# Patient Record
Sex: Male | Born: 1955 | Race: Black or African American | Hispanic: No | Marital: Married | Smoking: Current every day smoker
Health system: Southern US, Community
[De-identification: ages and names within clinical notes are randomized; demographics above are authoritative.]

## PROBLEM LIST (undated history)

## (undated) DIAGNOSIS — J302 Other seasonal allergic rhinitis: Secondary | ICD-10-CM

## (undated) DIAGNOSIS — I10 Essential (primary) hypertension: Secondary | ICD-10-CM

## (undated) DIAGNOSIS — K649 Unspecified hemorrhoids: Secondary | ICD-10-CM

## (undated) DIAGNOSIS — D126 Benign neoplasm of colon, unspecified: Secondary | ICD-10-CM

## (undated) DIAGNOSIS — K5790 Diverticulosis of intestine, part unspecified, without perforation or abscess without bleeding: Secondary | ICD-10-CM

## (undated) DIAGNOSIS — M8580 Other specified disorders of bone density and structure, unspecified site: Secondary | ICD-10-CM

## (undated) DIAGNOSIS — M1990 Unspecified osteoarthritis, unspecified site: Secondary | ICD-10-CM

## (undated) DIAGNOSIS — E785 Hyperlipidemia, unspecified: Secondary | ICD-10-CM

## (undated) HISTORY — PX: POLYPECTOMY: SHX149

## (undated) HISTORY — DX: Other specified disorders of bone density and structure, unspecified site: M85.80

## (undated) HISTORY — DX: Essential (primary) hypertension: I10

## (undated) HISTORY — DX: Unspecified osteoarthritis, unspecified site: M19.90

## (undated) HISTORY — PX: SHOULDER SURGERY: SHX246

## (undated) HISTORY — DX: Diverticulosis of intestine, part unspecified, without perforation or abscess without bleeding: K57.90

## (undated) HISTORY — DX: Unspecified hemorrhoids: K64.9

## (undated) HISTORY — PX: COLONOSCOPY: SHX174

## (undated) HISTORY — DX: Hyperlipidemia, unspecified: E78.5

## (undated) HISTORY — DX: Benign neoplasm of colon, unspecified: D12.6

## (undated) HISTORY — DX: Other seasonal allergic rhinitis: J30.2

---

## 2005-06-02 ENCOUNTER — Ambulatory Visit: Payer: Self-pay | Admitting: Family Medicine

## 2005-06-10 ENCOUNTER — Encounter: Admission: RE | Admit: 2005-06-10 | Discharge: 2005-06-10 | Payer: Self-pay | Admitting: Family Medicine

## 2005-07-11 ENCOUNTER — Ambulatory Visit: Payer: Self-pay | Admitting: Family Medicine

## 2005-07-13 ENCOUNTER — Emergency Department (HOSPITAL_COMMUNITY): Admission: EM | Admit: 2005-07-13 | Discharge: 2005-07-13 | Payer: Self-pay | Admitting: Emergency Medicine

## 2006-12-18 ENCOUNTER — Ambulatory Visit: Payer: Self-pay | Admitting: Family Medicine

## 2006-12-18 DIAGNOSIS — I10 Essential (primary) hypertension: Secondary | ICD-10-CM

## 2006-12-18 DIAGNOSIS — E782 Mixed hyperlipidemia: Secondary | ICD-10-CM | POA: Insufficient documentation

## 2006-12-21 ENCOUNTER — Ambulatory Visit: Payer: Self-pay | Admitting: Family Medicine

## 2006-12-21 ENCOUNTER — Encounter (INDEPENDENT_AMBULATORY_CARE_PROVIDER_SITE_OTHER): Payer: Self-pay | Admitting: Internal Medicine

## 2006-12-27 LAB — CONVERTED CEMR LAB
Basophils Relative: 0.1 % (ref 0.0–1.0)
Creatinine, Ser: 1.4 mg/dL (ref 0.4–1.5)
Eosinophils Absolute: 0.1 10*3/uL (ref 0.0–0.6)
Eosinophils Relative: 1.4 % (ref 0.0–5.0)
HCT: 43.2 % (ref 39.0–52.0)
HDL: 54.6 mg/dL (ref >39.0)
Lymphocytes Relative: 46.8 % — ABNORMAL HIGH (ref 12.0–46.0)
MCHC: 34 g/dL (ref 30.0–36.0)
MCV: 88.4 fL (ref 78.0–100.0)
Monocytes Absolute: 0.5 10*3/uL (ref 0.2–0.7)
Monocytes Relative: 10.5 % (ref 3.0–11.0)
Neutrophils Relative %: 41.2 % — ABNORMAL LOW (ref 43.0–77.0)
PSA: 0.67 ng/mL (ref 0.10–4.00)
Platelets: 186 10*3/uL (ref 150–400)
RBC: 4.88 M/uL (ref 4.22–5.81)
RDW: 13.2 % (ref 11.5–14.6)
Total Bilirubin: 0.7 mg/dL (ref 0.3–1.2)
Total CHOL/HDL Ratio: 5
Triglycerides: 142 mg/dL (ref 0–149)
WBC: 4.5 10*3/uL (ref 4.5–10.5)

## 2007-01-08 ENCOUNTER — Encounter: Payer: Self-pay | Admitting: Internal Medicine

## 2007-01-19 ENCOUNTER — Ambulatory Visit: Payer: Self-pay | Admitting: Family Medicine

## 2007-01-19 DIAGNOSIS — J019 Acute sinusitis, unspecified: Secondary | ICD-10-CM

## 2007-01-19 DIAGNOSIS — J309 Allergic rhinitis, unspecified: Secondary | ICD-10-CM | POA: Insufficient documentation

## 2007-02-21 ENCOUNTER — Ambulatory Visit: Payer: Self-pay | Admitting: Family Medicine

## 2007-03-29 ENCOUNTER — Ambulatory Visit: Payer: Self-pay | Admitting: Family Medicine

## 2007-03-30 LAB — CONVERTED CEMR LAB
HDL: 56.1 mg/dL (ref >39.0)
LDL Cholesterol: 120 mg/dL — ABNORMAL HIGH (ref 0–99)
Triglycerides: 100 mg/dL (ref 0–149)
VLDL: 20 mg/dL (ref 0–40)

## 2007-04-02 ENCOUNTER — Telehealth (INDEPENDENT_AMBULATORY_CARE_PROVIDER_SITE_OTHER): Payer: Self-pay | Admitting: Internal Medicine

## 2007-05-10 ENCOUNTER — Ambulatory Visit: Payer: Self-pay | Admitting: Internal Medicine

## 2007-05-10 DIAGNOSIS — F17200 Nicotine dependence, unspecified, uncomplicated: Secondary | ICD-10-CM

## 2007-11-13 ENCOUNTER — Ambulatory Visit: Payer: Self-pay | Admitting: Family Medicine

## 2007-11-19 ENCOUNTER — Ambulatory Visit: Payer: Self-pay | Admitting: Family Medicine

## 2007-11-22 LAB — CONVERTED CEMR LAB
ALT: 14 units/L (ref 0–53)
BUN: 11 mg/dL (ref 6–23)
CO2: 29 meq/L (ref 19–32)
Calcium: 9.2 mg/dL (ref 8.4–10.5)
HDL: 55.3 mg/dL (ref >39.0)
LDL Cholesterol: 91 mg/dL (ref 0–99)
Potassium: 4.6 meq/L (ref 3.5–5.1)
Total CHOL/HDL Ratio: 2.8
VLDL: 11 mg/dL (ref 0–40)

## 2008-05-13 ENCOUNTER — Ambulatory Visit: Payer: Self-pay | Admitting: Family Medicine

## 2008-05-14 ENCOUNTER — Encounter (INDEPENDENT_AMBULATORY_CARE_PROVIDER_SITE_OTHER): Payer: Self-pay | Admitting: Internal Medicine

## 2008-05-14 LAB — CONVERTED CEMR LAB
Chloride: 109 meq/L (ref 96–112)
Cholesterol: 212 mg/dL — ABNORMAL HIGH (ref 0–200)
GFR calc non Af Amer: 74.33 mL/min (ref >60)
Glucose, Bld: 83 mg/dL (ref 70–99)
HDL: 67.9 mg/dL (ref >39.00)
PSA: 0.57 ng/mL (ref 0.10–4.00)
Potassium: 4.3 meq/L (ref 3.5–5.1)
Sodium: 140 meq/L (ref 135–145)
Triglycerides: 54 mg/dL (ref 0.0–149.0)
VLDL: 10.8 mg/dL (ref 0.0–40.0)

## 2008-06-30 ENCOUNTER — Ambulatory Visit: Payer: Self-pay | Admitting: Family Medicine

## 2008-07-01 LAB — CONVERTED CEMR LAB
ALT: 24 units/L (ref 0–53)
AST: 22 units/L (ref 0–37)
HDL: 72.3 mg/dL (ref >39.00)
LDL Cholesterol: 97 mg/dL (ref 0–99)
Total CHOL/HDL Ratio: 3

## 2008-11-07 ENCOUNTER — Telehealth (INDEPENDENT_AMBULATORY_CARE_PROVIDER_SITE_OTHER): Payer: Self-pay | Admitting: Internal Medicine

## 2008-11-18 ENCOUNTER — Ambulatory Visit: Payer: Self-pay | Admitting: Family Medicine

## 2008-11-18 DIAGNOSIS — N529 Male erectile dysfunction, unspecified: Secondary | ICD-10-CM | POA: Insufficient documentation

## 2008-11-18 LAB — CONVERTED CEMR LAB
ALT: 23 units/L (ref 0–53)
AST: 22 units/L (ref 0–37)
BUN: 12 mg/dL (ref 6–23)
CO2: 28 meq/L (ref 19–32)
HDL: 62.1 mg/dL (ref >39.00)
LDL Cholesterol: 109 mg/dL — ABNORMAL HIGH (ref 0–99)
Sodium: 140 meq/L (ref 135–145)
Triglycerides: 63 mg/dL (ref 0.0–149.0)

## 2008-12-12 ENCOUNTER — Ambulatory Visit: Payer: Self-pay | Admitting: Family Medicine

## 2009-03-16 ENCOUNTER — Telehealth: Payer: Self-pay | Admitting: Family Medicine

## 2009-03-18 ENCOUNTER — Encounter: Payer: Self-pay | Admitting: Family Medicine

## 2009-04-23 ENCOUNTER — Ambulatory Visit: Payer: Self-pay | Admitting: Family Medicine

## 2009-06-16 ENCOUNTER — Ambulatory Visit: Payer: Self-pay | Admitting: Family Medicine

## 2009-06-18 LAB — CONVERTED CEMR LAB
BUN: 12 mg/dL (ref 6–23)
Bilirubin, Direct: 0.1 mg/dL (ref 0.0–0.3)
CO2: 27 meq/L (ref 19–32)
Calcium: 9.2 mg/dL (ref 8.4–10.5)
Chloride: 108 meq/L (ref 96–112)
GFR calc non Af Amer: 81.97 mL/min (ref >60)
HDL: 63.8 mg/dL (ref >39.00)
Total CHOL/HDL Ratio: 3
Total Protein: 6.5 g/dL (ref 6.0–8.3)
VLDL: 13.8 mg/dL (ref 0.0–40.0)

## 2009-12-21 ENCOUNTER — Ambulatory Visit: Payer: Self-pay | Admitting: Family Medicine

## 2010-02-09 NOTE — Assessment & Plan Note (Signed)
Summary: HA,SWOLLEN EYES/CLE   Vital Signs:  Patient profile:   55 year old male Height:      67 inches Weight:      193.25 pounds BMI:     30.38 Temp:     97.7 degrees F oral Pulse rate:   80 / minute Pulse rhythm:   regular BP sitting:   132 / 98  (left arm) Cuff size:   regular  Vitals Entered By: Linde Gillis CMA Duncan Dull) (April 23, 2009 3:07 PM) CC: sinuses   History of Present Illness: 55 yo with right sided sinus pressure x 2 weeks. Getting worse.  A little sinus congestion, but pressure is the bothersome part. Had chills over last couple of days.  Feels like he has so much pressure his right eye is swollen. No drainage from right eye.  No blurred vision, photophobia or eye pain. No cough, no wheezing, or SOB.  Had similar problem last year, also only on right side.  Current Medications (verified): 1)  Diovan 80 Mg  Tabs (Valsartan) .... 3 Once Daily For Bp 2)  Norvasc 5 Mg  Tabs (Amlodipine Besylate) .Marland Kitchen.. 1 Once Daily For Bp By Mouth 3)  Simvastatin 80 Mg Tabs (Simvastatin) .Marland Kitchen.. 1daily For Cholesterol 4)  Augmentin 875-125 Mg Tabs (Amoxicillin-Pot Clavulanate) .Marland Kitchen.. 1 Tab By Mouth Two Times A Day X 10 Days  Allergies (verified): No Known Drug Allergies  Review of Systems      See HPI General:  Complains of chills and fever. Eyes:  Denies blurring, eye pain, itching, and light sensitivity. ENT:  Complains of nasal congestion, sinus pressure, and sore throat; denies ear discharge and earache. CV:  Denies chest pain or discomfort. Resp:  Denies cough.  Physical Exam  General:  alert, well-developed, well-nourished, and well-hydrated.   Nose:  right sinuses eryth, edematus, tender left sinus ok Mouth:  pharynx pink and moist.   Lungs:  normal respiratory effort, no intercostal retractions, no accessory muscle use, and normal breath sounds.   Heart:  normal rate, regular rhythm, and no murmur.   Extremities:  no edema either lower leg Psych:  normally  interactive and slightly anxious.     Impression & Recommendations:  Problem # 1:  SINUSITIS- ACUTE-NOS (ICD-461.9) Assessment New Given duration and progression of symptoms, treat for bacterial sinusitis with Augmentin. Advised seeing an ENT as likely a structural issue, always occurs on right side and right sinus cavity is visibly more edematous and irritated than left.  He wants to think about referral.  Will get back to [place]. His updated medication list for this problem includes:    Augmentin 875-125 Mg Tabs (Amoxicillin-pot clavulanate) .Marland Kitchen... 1 tab by mouth two times a day x 10 days  Complete Medication List: 1)  Diovan 80 Mg Tabs (Valsartan) .... 3 once daily for bp 2)  Norvasc 5 Mg Tabs (Amlodipine besylate) .Marland Kitchen.. 1 once daily for bp by mouth 3)  Simvastatin 80 Mg Tabs (Simvastatin) .Marland Kitchen.. 1daily for cholesterol 4)  Augmentin 875-125 Mg Tabs (Amoxicillin-pot clavulanate) .Marland Kitchen.. 1 tab by mouth two times a day x 10 days  Patient Instructions: 1)  Take 400-600 mg of Ibuprofen (Advil, Motrin) with food every 4-6 hours as needed  for relief of pain or comfort of fever.  Prescriptions: AUGMENTIN 875-125 MG TABS (AMOXICILLIN-POT CLAVULANATE) 1 tab by mouth two times a day x 10 days  #20 x 0   Entered and Authorized by:   Ruthe Mannan MD   Signed by:  Ruthe Mannan MD on 04/23/2009   Method used:   [facility]        [facility] (ret[facility])       [address]       [place], [place]  [address]       Ph: [phone] or [phone]       Fax: [phone]   RxID:   [id number]   Current Allergies (reviewed today): No known allergies

## 2010-02-09 NOTE — Assessment & Plan Note (Signed)
Summary: 6 MONTH FOLLOW UP/[facility]   Vital Signs:  Patient profile:   55 year old male Height:      67 inches Weight:      196.38 pounds BMI:     30.87 Temp:     97.7 degrees F oral Pulse rate:   60 / minute Pulse rhythm:   regular BP sitting:   138 / 72  (left arm) Cuff size:   regular  Vitals Entered By: Linde Gillis CMA Duncan Dull) (June 16, 2009 8:07 AM) CC: 6 month follow up   History of Present Illness: 55 yo here for 6 month follow up HTN and HLD.    HLD- had been well controlled on Simvastatin 80 mg daily.  Last checked in December.  Since that time, he admits to not always being compliant with his diet or medications. Has gained 3 pounds since last OV. No myalgias with high dose Simvastatin.  HTN- has been well controlled on Diovan 240 mg daily and Norvasc 5 mg daily.  Needs refills.  No blurred vision, SOB, CP or LE edema.  Current Medications (verified): 1)  Diovan 80 Mg  Tabs (Valsartan) .... 3 Once Daily For Bp 2)  Norvasc 5 Mg  Tabs (Amlodipine Besylate) .Marland Kitchen.. 1 Once Daily For Bp By Mouth 3)  Simvastatin 80 Mg Tabs (Simvastatin) .Marland Kitchen.. 1daily For Cholesterol  Allergies (verified): No Known Drug Allergies  Review of Systems      See HPI General:  Denies malaise. Eyes:  Denies blurring. CV:  Denies chest pain or discomfort and shortness of breath with exertion. Resp:  Denies shortness of breath. GI:  Denies abdominal pain. MS:  Denies muscle weakness. Derm:  Denies rash. Neuro:  Denies headaches, visual disturbances, and weakness. Psych:  Denies anxiety and depression.  Physical Exam  General:  alert, well-developed, well-nourished, and well-hydrated.   Mouth:  pharynx pink and moist.   Lungs:  normal respiratory effort, no intercostal retractions, no accessory muscle use, and normal breath sounds.   Heart:  normal rate, regular rhythm, and no murmur.   Extremities:  no edema either lower leg Psych:  normally interactive and slightly anxious.     Impression &  Recommendations:  Problem # 1:  HYPERLIPIDEMIA (ICD-272.2) Assessment Unchanged Recheck FLP, hepatic panel today given that he is on high dose Simvastatin. His updated medication list for this problem includes:    Simvastatin 80 Mg Tabs (Simvastatin) .Marland Kitchen... 1daily for cholesterol  Orders: Venipuncture ([address]) TLB-Lipid Panel (80061-LIPID) TLB-Hepatic/Liver Function Pnl (80076-HEPATIC)  Problem # 2:  HYPERTENSION (ICD-401.9) Assessment: Unchanged Continue current meds. His updated medication list for this problem includes:    Diovan 80 Mg Tabs (Valsartan) .Marland KitchenMarland KitchenMarland KitchenMarland Kitchen 3 once daily for bp    Norvasc 5 Mg Tabs (Amlodipine besylate) .Marland Kitchen... 1 once daily for bp by mouth  Orders: Venipuncture (62130) TLB-BMP (Basic Metabolic Panel-BMET) (80048-METABOL)  Problem # 3:  ERECTILE DYSFUNCTION, ORGANIC (ICD-607.84) Assessment: Unchanged His insurance company no longer covering levitra.  He will call to find out what they do cover.  Complete Medication List: 1)  Diovan 80 Mg Tabs (Valsartan) .... 3 once daily for bp 2)  Norvasc 5 Mg Tabs (Amlodipine besylate) .Marland Kitchen.. 1 once daily for bp by mouth 3)  Simvastatin 80 Mg Tabs (Simvastatin) .Marland Kitchen.. 1daily for cholesterol Prescriptions: DIOVAN 80 MG  TABS (VALSARTAN) 3 once daily for BP  #90 Tablet x 6   Entered and Authorized by:   Ruthe Mannan MD   Signed by:   Ruthe Mannan  MD on 06/16/2009   Method used:   [facility]        [facility] ([facility]ail)       [address]       [place], [place]  [address]       Ph: [phone] or [phone]       Fax: [phone]   RxID:   [id number] NORVASC 5 MG  TABS (AMLODIPINE BESYLATE) 1 once daily for BP by mouth  #90 x 11   Entered and Authorized by:   Ruthe Mannan MD   Signed by:   Ruthe Mannan MD on 06/16/2009   Method used:   [facility]        [facility] (retail)       [address]       [place], [place]  [address]       Ph: [phone] or [phone]        Fax: [phone]   RxID:   [id number]   Current Allergies (reviewed today): No known allergies

## 2010-02-09 NOTE — Medication Information (Signed)
Summary: Approval for Diovan/[employer]  Approval for Diovan/[employer]   Imported By: Lanelle Bal 03/24/2009 08:58:48  _____________________________________________________________________  External Attachment:    Type:   Image     Comment:   External Document

## 2010-02-09 NOTE — Progress Notes (Signed)
Summary: Prior Authorization Diovan 80mg   Phone Note From Pharmacy Call back at ph [phone] fax [phone]   [facility] [address] [id number] Call For: Dr. Dayton Martes  Summary of Call: Received fax from pharmacy stating that PA is needed for Diovan 80mg .  Called [phone] to request paperwork, it will be faxed to our office today.  Linde Gillis CMA Duncan Dull)  March 16, 2009 11:44 AM   Received PA form, in your IN box.  Linde Gillis CMA Duncan Dull)  March 16, 2009 3:02 PM    Follow-up for Phone Call        I will not be in office until Weds. . I am just trying to catch up on work but still on vacation. Thank you. Follow-up by: Ruthe Mannan MD,  March 16, 2009 4:42 PM  Additional Follow-up for Phone Call Additional follow up Details #1::        In my box. Additional Follow-up by: Ruthe Mannan MD,  March 18, 2009 10:02 AM    Additional Follow-up for Phone Call Additional follow up Details #2::    Form faxed to [employer] at [phone] Follow-up by: Linde Gillis CMA Duncan Dull),  March 18, 2009 10:07 AM   Appended Document: Prior Authorization Diovan 80mg  Received PA Approval for Diovan.  Approved through 03/18/2010, pharmacy and patient notified.

## 2010-02-09 NOTE — Letter (Signed)
Summary: Out of Work  [facility]  [address] [place], [place] [address]   Phone: [phone]  Fax: [phone]    April 23, 2009   Employee:  Victorino Dike    To Whom It May Concern:   For Medical reasons, please excuse the above named employee from work for the following dates:  Start:   04/22/2009  End:   04/27/2009  If you need additional information, please feel free to contact our office.         Sincerely,    Ruthe Mannan MD

## 2010-02-11 NOTE — Assessment & Plan Note (Signed)
Summary: 6 MONTH FOLLOW UP/[facility]   Vital Signs:  Patient profile:   55 year old Erik [name] Height:      67 inches Weight:      198 pounds BMI:     31.12 Temp:     98.5 degrees F oral Pulse rate:   68 / minute Pulse rhythm:   regular BP sitting:   140 / 80  (left arm) Cuff size:   regular  Vitals Entered By: [name] CMA Duncan Dull) (December 21, 2009 8:07 AM)  History of Present Illness: 55 yo here for 6 month follow up HTN and HLD.    HLD- had been well controlled on Simvastatin 80 mg daily.  Last checked in June.  Since that time, he admits to not always being compliant with his diet or medications. . No myalgias with high dose Simvastatin, has been on it for years.  HTN- has been well controlled on Diovan 240 mg daily and Norvasc 5 mg daily.  Needs refills.  No blurred vision, SOB, CP or LE edema. Rushed to get her this morning, slightly elevated.  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  Current Medications (verified): 1)  Diovan 80 Mg  Tabs (Valsartan) .... 3 Once Daily For Bp 2)  Norvasc 5 Mg  Tabs (Amlodipine Besylate) .Marland Kitchen.. 1 Once Daily For Bp By Mouth 3)  Simvastatin 80 Mg Tabs (Simvastatin) .Marland Kitchen.. 1daily For Cholesterol  Allergies (verified): No Known Drug Allergies  Past History:  Past Medical History: Last updated: 01/26/2007 Hyperlipidemia Hypertension  Past Surgical History: Last updated: 01-26-2007 1973 / 1997   Right shoulder  Family History: Last updated: 2007/01/26 Father: Died 37 - MI (#1) Mother: Alive 78 L & W Siblings: 2 brothers alive, one with HBP               2 sisters L & W  PGF died 37, old age PGM died 11, CVA DM: P. Uncles x 2 Breast CA:  MGM died at 13  Social History: Last updated: 01-26-07 Marital Status: Married Children: 2 (22 and 15) both at home Occupation: Naval architect (tanker gas truck) Current Smoker, 1/2 PPD Alcohol use-yes, rare Regular exercise-no  Risk Factors: Alcohol Use: <1 (05/13/2008) Caffeine Use: 10  (05/13/2008) Exercise: no (05/13/2008)  Risk Factors: Smoking Status: never (11/18/2008) Packs/Day: 12 cigs qd (11/18/2008) Cigars/wk: 0 (05/13/2008) Cans of tobacco/wk: 0 (05/13/2008) Passive Smoke Exposure: no (12/18/2006)  Review of Systems      See HPI Eyes:  Denies blurring. CV:  Denies chest pain or discomfort. Resp:  Denies shortness of breath. GI:  Denies abdominal pain.  Physical Exam  General:  alert, well-developed, well-nourished, and well-hydrated.   Lungs:  normal respiratory effort, no intercostal retractions, no accessory muscle use, and normal breath sounds.   Heart:  normal rate, regular rhythm, and no murmur.   Extremities:  no edema either lower leg Psych:  normally interactive and slightly anxious.     Impression & Recommendations:  Problem # 1:  HYPERLIPIDEMIA (ICD-272.2) Assessment Unchanged Stable, continue Simvastatin. Follow up 6 months, no need to recheck labs today. His updated medication list for this problem includes:    Simvastatin 80 Mg Tabs (Simvastatin) .Marland Kitchen... 1daily for cholesterol  Problem # 2:  HYPERTENSION (ICD-401.9) Assessment: Unchanged Continue current meds.  Follow up in 6 months. His updated medication list for this problem includes:    Diovan 80 Mg Tabs (Valsartan) .Marland KitchenMarland KitchenMarland KitchenMarland Kitchen 3 once daily for bp    Norvasc 5 Mg Tabs (Amlodipine besylate) .Marland KitchenMarland KitchenMarland KitchenMarland Kitchen  1 once daily for bp by mouth  Complete Medication List: 1)  Diovan 80 Mg Tabs (Valsartan) .... 3 once daily for bp 2)  Norvasc 5 Mg Tabs (Amlodipine besylate) .Marland Kitchen.. 1 once daily for bp by mouth 3)  Simvastatin 80 Mg Tabs (Simvastatin) .Marland Kitchen.. 1daily for cholesterol Prescriptions: SIMVASTATIN 80 MG TABS (SIMVASTATIN) 1daily for cholesterol  #90 x 3   Entered by:   [name] CMA (AAMA)   Authorized by:   Ruthe Mannan MD   Signed by:   [name] CMA (AAMA) on 12/21/2009   Method used:   [facility]        [facility] ([facility]ail)       [address]        [place], [place]  [address]       Ph: [phone] or [phone]       Fax: [phone]   RxID:   [id number] NORVASC 5 MG  TABS (AMLODIPINE BESYLATE) 1 once daily for BP by mouth  #90 x 3   Entered by:   [name] CMA (AAMA)   Authorized by:   Ruthe Mannan MD   Signed by:   [name] CMA (AAMA) on 12/21/2009   Method used:   [facility]        [facility] (retail)       [address]       [place], [place]  [address]       Ph: [phone] or [phone]       Fax: [phone]   RxID:   [id number] DIOVAN 80 MG  TABS (VALSARTAN) 3 once daily for BP  #90 Tablet x 11   Entered by:   [name] CMA (AAMA)   Authorized by:   Ruthe Mannan MD   Signed by:   [name] CMA (AAMA) on 12/21/2009   Method used:   [facility]        [id number]       [address]       [place], [place]  [address]       Ph: [phone] or [phone]       Fax: [phone]   RxID:   [id number]    Orders Added: 1)  Est. Patient Level III [[address]]    Current Allergies (reviewed today): No known allergies

## 2010-04-13 ENCOUNTER — Telehealth: Payer: Self-pay | Admitting: *Deleted

## 2010-04-13 NOTE — Telephone Encounter (Signed)
Prior auth needed for diovan, for 3 a day dosing.  Form is on your desk.

## 2010-04-13 NOTE — Telephone Encounter (Signed)
In my box

## 2010-04-14 NOTE — Telephone Encounter (Signed)
Prior auth given for diovan.  Advised pharmacy.  Approval letter placed on doctor's desk for signature and scanning.

## 2010-06-18 ENCOUNTER — Encounter: Payer: Self-pay | Admitting: Family Medicine

## 2010-06-21 ENCOUNTER — Encounter: Payer: Self-pay | Admitting: Family Medicine

## 2010-06-21 ENCOUNTER — Ambulatory Visit (INDEPENDENT_AMBULATORY_CARE_PROVIDER_SITE_OTHER): Payer: 59 | Admitting: Family Medicine

## 2010-06-21 VITALS — BP 130/90 | HR 68 | Temp 97.6°F | Ht 67.0 in | Wt 195.8 lb

## 2010-06-21 DIAGNOSIS — F17200 Nicotine dependence, unspecified, uncomplicated: Secondary | ICD-10-CM

## 2010-06-21 DIAGNOSIS — E782 Mixed hyperlipidemia: Secondary | ICD-10-CM

## 2010-06-21 DIAGNOSIS — I10 Essential (primary) hypertension: Secondary | ICD-10-CM

## 2010-06-21 LAB — LIPID PANEL
HDL: 75.1 mg/dL (ref >39.00)
Total CHOL/HDL Ratio: 3
Triglycerides: 77 mg/dL (ref 0.0–149.0)

## 2010-06-21 LAB — HEPATIC FUNCTION PANEL
Bilirubin, Direct: 0.1 mg/dL (ref 0.0–0.3)
Total Bilirubin: 0.6 mg/dL (ref 0.3–1.2)
Total Protein: 7.7 g/dL (ref 6.0–8.3)

## 2010-06-21 LAB — BASIC METABOLIC PANEL
BUN: 12 mg/dL (ref 6–23)
CO2: 25 mEq/L (ref 19–32)
GFR: 77.15 mL/min (ref >60.00)
Potassium: 4.3 mEq/L (ref 3.5–5.1)

## 2010-06-21 LAB — LDL CHOLESTEROL, DIRECT: Direct LDL: 121.3 mg/dL

## 2010-06-21 MED ORDER — AMLODIPINE BESYLATE 5 MG PO TABS: 5.0000 mg | ORAL_TABLET | Freq: Every day | ORAL | Status: DC

## 2010-06-21 MED ORDER — VARENICLINE TARTRATE 0.5 MG X 11 & 1 MG X 42 PO MISC: ORAL | Status: AC

## 2010-06-21 MED ORDER — SIMVASTATIN 80 MG PO TABS
80.0000 mg | ORAL_TABLET | Freq: Every day | ORAL | Status: DC
Start: 2010-06-21 — End: 2011-06-30

## 2010-06-21 NOTE — Progress Notes (Signed)
55 yo here for 6 month follow up HTN and HLD.    HLD- had been well controlled on Simvastatin 80 mg daily. . No myalgias with high dose Simvastatin, has been on it for years.  Lab Results  Component Value Date   HDL 63.80 06/16/2009   HDL 62.10 11/18/2008   HDL 16.10 06/30/2008   Lab Results  Component Value Date   LDLCALC 100* 06/16/2009   LDLCALC 109* 11/18/2008   LDLCALC 97 06/30/2008   Lab Results  Component Value Date   TRIG 69.0 06/16/2009   TRIG 63.0 11/18/2008   TRIG 88.0 06/30/2008   HTN- has been well controlled on Diovan 240 mg daily and Norvasc 5 mg daily.  Needs refills.  No blurred vision, SOB, CP or LE edema. Rushed to get her this morning, slightly elevated.  Tobacco abuse- 1 ppd for over 17 years.  Finally ready to quit.  Quit once before when he punctured a lung on the job.  Has never used patches. Has tried Zyban unsuccessfully.   Review of Systems       See HPI Eyes:  Denies blurring. CV:  Denies chest pain or discomfort. Resp:  Denies shortness of breath. GI:  Denies abdominal pain.  Physical Exam BP 130/90  Pulse 68  Temp(Src) 97.6 F (36.4 C) (Oral)  Ht 5\' 7"  (1.702 m)  Wt 195 lb 12 oz (88.792 kg)  BMI 30.66 kg/m2  General:  alert, well-developed, well-nourished, and well-hydrated.   Lungs:  normal respiratory effort, no intercostal retractions, no accessory muscle use, and normal breath sounds.   Heart:  normal rate, regular rhythm, and no murmur.   Extremities:  no edema either lower leg Psych:  normally interactive and slightly anxious.    1. HYPERTENSION  Stable, continue current meds. Check BMET Basic Metabolic Panel (BMET)   2. HYPERLIPIDEMIA  Stable, recheck lipid panel and LFTs. Lipid panel, Hepatic function panel  3. NICOTINE ADDICTION  Unchanged, ready to quit. Rx for Chantix given as well as counseled on risk factors and usages.

## 2010-06-22 ENCOUNTER — Ambulatory Visit: Payer: Self-pay | Admitting: Family Medicine

## 2010-12-20 ENCOUNTER — Ambulatory Visit (INDEPENDENT_AMBULATORY_CARE_PROVIDER_SITE_OTHER): Payer: 59 | Admitting: Family Medicine

## 2010-12-20 DIAGNOSIS — I10 Essential (primary) hypertension: Secondary | ICD-10-CM

## 2010-12-20 DIAGNOSIS — Z0289 Encounter for other administrative examinations: Secondary | ICD-10-CM

## 2010-12-20 DIAGNOSIS — E782 Mixed hyperlipidemia: Secondary | ICD-10-CM

## 2010-12-20 NOTE — Progress Notes (Signed)
No show

## 2011-06-30 ENCOUNTER — Ambulatory Visit (INDEPENDENT_AMBULATORY_CARE_PROVIDER_SITE_OTHER): Payer: BC Managed Care – PPO | Admitting: Family Medicine

## 2011-06-30 ENCOUNTER — Encounter: Payer: Self-pay | Admitting: Family Medicine

## 2011-06-30 VITALS — BP 132/100 | HR 76 | Temp 97.7°F | Wt 195.0 lb

## 2011-06-30 DIAGNOSIS — I10 Essential (primary) hypertension: Secondary | ICD-10-CM

## 2011-06-30 DIAGNOSIS — J019 Acute sinusitis, unspecified: Secondary | ICD-10-CM

## 2011-06-30 MED ORDER — SIMVASTATIN 80 MG PO TABS: 80.0000 mg | ORAL_TABLET | Freq: Every day | ORAL | Status: DC

## 2011-06-30 MED ORDER — FLUTICASONE PROPIONATE 50 MCG/ACT NA SUSP: 2.0000 | Freq: Every day | NASAL | Status: DC

## 2011-06-30 MED ORDER — VALSARTAN 80 MG PO TABS: ORAL_TABLET | ORAL | Status: DC

## 2011-06-30 MED ORDER — AMOXICILLIN-POT CLAVULANATE 875-125 MG PO TABS: 1.0000 | ORAL_TABLET | Freq: Two times a day (BID) | ORAL | Status: AC

## 2011-06-30 MED ORDER — AMLODIPINE BESYLATE 5 MG PO TABS: 5.0000 mg | ORAL_TABLET | Freq: Every day | ORAL | Status: DC

## 2011-06-30 NOTE — Patient Instructions (Addendum)
Take antibiotic as directed- Augmentin 1 tab twice daily x 10 days.  Drink lots of fluids.  Treat sympotmatically with flonase, Mucinex, nasal saline irrigation, and Tylenol/Ibuprofen. Call if not improving as expected in 5-7 days.  Please take your blood pressure medication- I have sent in refills.

## 2011-06-30 NOTE — Progress Notes (Signed)
SUBJECTIVE:  Erik [name] is a 56 y.o. male who complains of coryza, congestion, sore throat and right sinus pain for 20 days. He denies a history of anorexia, chest pain, chills, dizziness, fatigue, fevers, myalgias, nausea and shortness of breath and denies a history of asthma.   BP elevated, ran out of meds weeks ago. Has appt for physical next month.  Patient Active Problem List  Diagnosis  . HYPERLIPIDEMIA  . NICOTINE ADDICTION  . HYPERTENSION  . SINUSITIS- ACUTE-NOS  . VASOMOTOR RHINITIS  . ERECTILE DYSFUNCTION, ORGANIC   Past Medical History  Diagnosis Date  . Hyperlipidemia   . Hypertension    Past Surgical History  Procedure Date  . Shoulder surgery 1973 & 1997   History  Substance Use Topics  . Smoking status: Current Everyday Smoker -- .5 years  . Smokeless tobacco: Not on file  . Alcohol Use: Yes   Family History  Problem Relation Age of Onset  . Hypertension Brother   . Diabetes Paternal Uncle   . Cancer Maternal Grandmother     breast   No Known Allergies Current Outpatient Prescriptions on File Prior to Visit  Medication Sig Dispense Refill  . DISCONTD: amLODipine (NORVASC) 5 MG tablet Take 1 tablet (5 mg total) by mouth daily.  30 tablet  6  . DISCONTD: simvastatin (ZOCOR) 80 MG tablet Take 1 tablet (80 mg total) by mouth at bedtime.  30 tablet  6  . DISCONTD: valsartan (DIOVAN) 80 MG tablet Take by mouth. Take three tablets by mouth once daily       . fluticasone (FLONASE) 50 MCG/ACT nasal spray Place 2 sprays into the nose daily.  16 g  6   The PMH, PSH, Social History, Family History, Medications, and allergies have been reviewed in [facility], and have been updated if relevant.  OBJECTIVE: BP 132/100  Pulse 76  Temp 97.7 F (36.5 C)  Wt 195 lb (88.451 kg)  He appears well, vital signs are as noted. Ears normal.  Throat and pharynx normal.  Neck supple. No adenopathy in the neck. Nose is congested. Sinuses  tender. The chest is clear, without wheezes  or rales.  ASSESSMENT:  sinusitis  PLAN: Given duration and progression of symptoms, will treat for bacterial sinusitis with Augmentin. Symptomatic therapy suggested: flonase, push fluids, rest and return office visit prn if symptoms persist or worsen. Call or return to clinic prn if these symptoms worsen or fail to improve as anticipated.

## 2011-07-27 ENCOUNTER — Encounter: Payer: Self-pay | Admitting: Family Medicine

## 2011-07-27 ENCOUNTER — Encounter: Payer: Self-pay | Admitting: Internal Medicine

## 2011-07-27 ENCOUNTER — Ambulatory Visit (INDEPENDENT_AMBULATORY_CARE_PROVIDER_SITE_OTHER): Payer: BC Managed Care – PPO | Admitting: Family Medicine

## 2011-07-27 VITALS — BP 140/92 | HR 84 | Temp 97.9°F | Ht 67.5 in | Wt 193.0 lb

## 2011-07-27 DIAGNOSIS — Z0001 Encounter for general adult medical examination with abnormal findings: Secondary | ICD-10-CM | POA: Insufficient documentation

## 2011-07-27 DIAGNOSIS — Z125 Encounter for screening for malignant neoplasm of prostate: Secondary | ICD-10-CM

## 2011-07-27 DIAGNOSIS — E782 Mixed hyperlipidemia: Secondary | ICD-10-CM

## 2011-07-27 DIAGNOSIS — I10 Essential (primary) hypertension: Secondary | ICD-10-CM

## 2011-07-27 DIAGNOSIS — Z0000 Encounter for general adult medical examination without abnormal findings: Secondary | ICD-10-CM

## 2011-07-27 DIAGNOSIS — Z1211 Encounter for screening for malignant neoplasm of colon: Secondary | ICD-10-CM

## 2011-07-27 LAB — COMPREHENSIVE METABOLIC PANEL
ALT: 18 U/L (ref 0–53)
AST: 19 U/L (ref 0–37)
Albumin: 3.6 g/dL (ref 3.5–5.2)
Alkaline Phosphatase: 75 U/L (ref 39–117)
BUN: 11 mg/dL (ref 6–23)
CO2: 26 mEq/L (ref 19–32)
Chloride: 104 mEq/L (ref 96–112)
GFR: 78.29 mL/min (ref >60.00)
Glucose, Bld: 97 mg/dL (ref 70–99)
Total Bilirubin: 0.5 mg/dL (ref 0.3–1.2)
Total Protein: 6.9 g/dL (ref 6.0–8.3)

## 2011-07-27 LAB — CBC WITH DIFFERENTIAL/PLATELET
Hemoglobin: 13.6 g/dL (ref 13.0–17.0)
Lymphocytes Relative: 31.7 % (ref 12.0–46.0)
WBC: 6.4 10*3/uL (ref 4.5–10.5)

## 2011-07-27 LAB — LIPID PANEL
Cholesterol: 200 mg/dL (ref 0–200)
Triglycerides: 81 mg/dL (ref 0.0–149.0)
VLDL: 16.2 mg/dL (ref 0.0–40.0)

## 2011-07-27 NOTE — Progress Notes (Signed)
56 yo here for CPX.  HLD- had been well controlled on Simvastatin 80 mg daily. . No myalgias with high dose Simvastatin, has been on it for years.  Lab Results  Component Value Date   CHOL 213* 06/21/2010   HDL 75.10 06/21/2010   LDLCALC 100* 06/16/2009   LDLDIRECT 121.3 06/21/2010   TRIG 77.0 06/21/2010   CHOLHDL 3 06/21/2010    HTN- has been well controlled on Diovan 240 mg daily and Norvasc 5 mg daily.  Needs refills.  No blurred vision, SOB, CP or LE edema. Rushed to get her this morning, slightly elevated.  Tobacco abuse- 1 ppd for over 17 years.  Was ready to quit at last physical, tried Chantix but did not follow through.  He is cutting back on his own now and feeling better.  Wants to try Chantix again later.      Patient Active Problem List  Diagnosis  . HYPERLIPIDEMIA  . NICOTINE ADDICTION  . HYPERTENSION  . SINUSITIS- ACUTE-NOS  . VASOMOTOR RHINITIS  . ERECTILE DYSFUNCTION, ORGANIC  . Routine general medical examination at a health care facility   Past Medical History  Diagnosis Date  . Hyperlipidemia   . Hypertension    Past Surgical History  Procedure Date  . Shoulder surgery 1973 & 1997   History  Substance Use Topics  . Smoking status: Current Everyday Smoker -- .5 years  . Smokeless tobacco: Not on file  . Alcohol Use: Yes   Family History  Problem Relation Age of Onset  . Hypertension Brother   . Diabetes Paternal Uncle   . Cancer Maternal Grandmother     breast   No Known Allergies Current Outpatient Prescriptions on File Prior to Visit  Medication Sig Dispense Refill  . amLODipine (NORVASC) 5 MG tablet Take 1 tablet (5 mg total) by mouth daily.  30 tablet  6  . fluticasone (FLONASE) 50 MCG/ACT nasal spray Place 2 sprays into the nose daily.  16 g  6  . simvastatin (ZOCOR) 80 MG tablet Take 1 tablet (80 mg total) by mouth at bedtime.  30 tablet  6  . valsartan (DIOVAN) 80 MG tablet Take three tablets by mouth once daily  90 tablet  3   The PMH,  PSH, Social History, Family History, Medications, and allergies have been reviewed in [facility], and have been updated if relevant.  Review of Systems       See HPI Patient reports no  vision/ hearing changes,anorexia, weight change, fever ,adenopathy, persistant / recurrent hoarseness, swallowing issues, chest pain, edema,persistant / recurrent cough, hemoptysis, dyspnea(rest, exertional, paroxysmal nocturnal), gastrointestinal  bleeding (melena, rectal bleeding), abdominal pain, excessive heart burn, GU symptoms(dysuria, hematuria, pyuria, voiding/incontinence  Issues) syncope, focal weakness, severe memory loss, concerning skin lesions, depression, anxiety, abnormal bruising/bleeding, major joint swelling.   Marland Kitchen  Physical Exam BP 140/92  Pulse 84  Temp 97.9 F (36.6 C)  Ht 5' 7.5" (1.715 m)  Wt 193 lb (87.544 kg)  BMI 29.78 kg/m2  General:  overweght male in NAD Eyes:  PERRL Ears:  External ear exam shows no significant lesions or deformities.  Otoscopic examination reveals clear canals, tympanic membranes are intact bilaterally without bulging, retraction, inflammation or discharge. Hearing is grossly normal bilaterally. Nose:  External nasal examination shows no deformity or inflammation. Nasal mucosa are pink and moist without lesions or exudates. Mouth:  Oral mucosa and oropharynx without lesions or exudates.  Teeth in good repair. Neck:  no carotid bruit or thyromegaly no  cervical or supraclavicular lymphadenopathy  Lungs:  Normal respiratory effort, chest expands symmetrically. Lungs are clear to auscultation, no crackles or wheezes. Heart:  Normal rate and regular rhythm. S1 and S2 normal without gallop, murmur, click, rub or other extra sounds. Abdomen:  Bowel sounds positive,abdomen soft and non-tender without masses, organomegaly or hernias noted. Pulses:  R and L posterior tibial pulses are full and equal bilaterally  Extremities:  no edema    Assessment and Plan: 1.  HYPERLIPIDEMIA  Stable on current meds. Recheck labs today. Lipid Panel  2. HYPERTENSION  Stable on current meds. Comprehensive metabolic panel  3. Routine general medical examination at a health care facility  Reviewed preventive care protocols, scheduled due services, and updated immunizations Discussed nutrition, exercise, diet, and healthy lifestyle.  CBC with Differential  4. Screening for prostate cancer  PSA  5. Screening for colon cancer  Ambulatory referral to Gastroenterology

## 2011-07-27 NOTE — Patient Instructions (Addendum)
Good to see you. Please stop by to see Shirlee Limerick on your way out.  Health Maintenance, Males A healthy lifestyle and preventative care can promote health and wellness.  Maintain regular health, dental, and eye exams.   Eat a healthy diet. Foods like vegetables, fruits, whole grains, low-fat dairy products, and lean protein foods contain the nutrients you need without too many calories. Decrease your intake of foods high in solid fats, added sugars, and salt. Get information about a proper diet from your caregiver, if necessary.   Regular physical exercise is one of the most important things you can do for your health. Most adults should get at least 150 minutes of moderate-intensity exercise (any activity that increases your heart rate and causes you to sweat) each week. In addition, most adults need muscle-strengthening exercises on 2 or more days a week.    Maintain a healthy weight. The body mass index (BMI) is a screening tool to identify possible weight problems. It provides an estimate of body fat based on height and weight. Your caregiver can help determine your BMI, and can help you achieve or maintain a healthy weight. For adults 20 years and older:   A BMI below 18.5 is considered underweight.   A BMI of 18.5 to 24.9 is normal.   A BMI of 25 to 29.9 is considered overweight.   A BMI of 30 and above is considered obese.   Maintain normal blood lipids and cholesterol by exercising and minimizing your intake of saturated fat. Eat a balanced diet with plenty of fruits and vegetables. Blood tests for lipids and cholesterol should begin at age 45 and be repeated every 5 years. If your lipid or cholesterol levels are high, you are over 50, or you are a high risk for heart disease, you may need your cholesterol levels checked more frequently.Ongoing high lipid and cholesterol levels should be treated with medicines, if diet and exercise are not effective.   If you smoke, find out from your  caregiver how to quit. If you do not use tobacco, do not start.   If you choose to drink alcohol, do not exceed 2 drinks per day. One drink is considered to be 12 ounces (355 mL) of beer, 5 ounces (148 mL) of wine, or 1.5 ounces (44 mL) of liquor.   Avoid use of street drugs. Do not share needles with anyone. Ask for help if you need support or instructions about stopping the use of drugs.   High blood pressure causes heart disease and increases the risk of stroke. Blood pressure should be checked at least every 1 to 2 years. Ongoing high blood pressure should be treated with medicines if weight loss and exercise are not effective.   If you are 83 to 56 years old, ask your caregiver if you should take aspirin to prevent heart disease.   Diabetes screening involves taking a blood sample to check your fasting blood sugar level. This should be done once every 3 years, after age 79, if you are within normal weight and without risk factors for diabetes. Testing should be considered at a younger age or be carried out more frequently if you are overweight and have at least 1 risk factor for diabetes.   Colorectal cancer can be detected and often prevented. Most routine colorectal cancer screening begins at the age of 30 and continues through age 9. However, your caregiver may recommend screening at an earlier age if you have risk factors for colon cancer.  On a yearly basis, your caregiver may provide home test kits to check for hidden blood in the stool. Use of a small camera at the end of a tube, to directly examine the colon (sigmoidoscopy or colonoscopy), can detect the earliest forms of colorectal cancer. Talk to your caregiver about this at age 44, when routine screening begins. Direct examination of the colon should be repeated every 5 to 10 years through age 35, unless early forms of pre-cancerous polyps or small growths are found.   Hepatitis C blood testing is recommended for all people born from  [birth year] through 1965 and any individual with known risks for hepatitis C.   Healthy men should no longer receive prostate-specific antigen (PSA) blood tests as part of routine cancer screening. Consult with your caregiver about prostate cancer screening.   Testicular cancer screening is not recommended for adolescents or adult males who have no symptoms. Screening includes self-exam, caregiver exam, and other screening tests. Consult with your caregiver about any symptoms you have or any concerns you have about testicular cancer.   Practice safe sex. Use condoms and avoid high-risk sexual practices to reduce the spread of sexually transmitted infections (STIs).   Use sunscreen with a sun protection factor (SPF) of 30 or greater. Apply sunscreen liberally and repeatedly throughout the day. You should seek shade when your shadow is shorter than you. Protect yourself by wearing long sleeves, pants, a wide-brimmed hat, and sunglasses year round, whenever you are outdoors.   Notify your caregiver of new moles or changes in moles, especially if there is a change in shape or color. Also notify your caregiver if a mole is larger than the size of a pencil eraser.   A one-time screening for abdominal aortic aneurysm (AAA) and surgical repair of large AAAs by sound wave imaging (ultrasonography) is recommended for ages 39 to 27 years who are current or former smokers.   Stay current with your immunizations.  Document Released: 06/25/2007 Document Revised: 12/16/2010 Document Reviewed: 05/24/2010 [facility] Patient Information 2012 [place], [place].

## 2011-09-13 ENCOUNTER — Encounter: Payer: Self-pay | Admitting: Internal Medicine

## 2011-09-13 ENCOUNTER — Ambulatory Visit (AMBULATORY_SURGERY_CENTER): Payer: BC Managed Care – PPO | Admitting: *Deleted

## 2011-09-13 VITALS — Ht 68.5 in | Wt 195.8 lb

## 2011-09-13 DIAGNOSIS — Z1211 Encounter for screening for malignant neoplasm of colon: Secondary | ICD-10-CM

## 2011-09-13 MED ORDER — NA SULFATE-K SULFATE-MG SULF 17.5-3.13-1.6 GM/177ML PO SOLN: 1.0000 | Freq: Once | ORAL | Status: DC

## 2011-09-26 ENCOUNTER — Encounter: Payer: Self-pay | Admitting: Internal Medicine

## 2011-09-26 ENCOUNTER — Ambulatory Visit (AMBULATORY_SURGERY_CENTER): Payer: BC Managed Care – PPO | Admitting: Internal Medicine

## 2011-09-26 VITALS — BP 97/60 | HR 62 | Temp 98.2°F | Resp 16 | Ht 68.0 in | Wt 195.0 lb

## 2011-09-26 DIAGNOSIS — D126 Benign neoplasm of colon, unspecified: Secondary | ICD-10-CM

## 2011-09-26 DIAGNOSIS — D128 Benign neoplasm of rectum: Secondary | ICD-10-CM

## 2011-09-26 DIAGNOSIS — D129 Benign neoplasm of anus and anal canal: Secondary | ICD-10-CM

## 2011-09-26 DIAGNOSIS — Z1211 Encounter for screening for malignant neoplasm of colon: Secondary | ICD-10-CM

## 2011-09-26 MED ORDER — SODIUM CHLORIDE 0.9 % IV SOLN: 500.0000 mL | INTRAVENOUS | Status: DC

## 2011-09-26 NOTE — Progress Notes (Signed)
Propofol given and oxygen managed per S Camp CRNA 

## 2011-09-26 NOTE — Patient Instructions (Signed)
Colon polyps x6 removed today, diverticulosis and hemorrhoids seen today also. Handouts given. Try to follow high fiber diet,see handout. Await pathology results. Repeat colonoscopy depends on pathology results. HOLD ASPIRIN, ASPIRIN PRODUCTS, AND ANTI-INFLAMMATORY MEDICATIONS FOR 1 WEEK, UNTIL 10-03-11.  Call us with any questions or concerns. Thank you!!  YOU HAD AN ENDOSCOPIC PROCEDURE TODAY AT THE [facility]: Refer to the procedure report that was given to you for any specific questions about what was found during the examination.  If the procedure report does not answer your questions, please call your gastroenterologist to clarify.  If you requested that your care partner not be given the details of your procedure findings, then the procedure report has been included in a sealed envelope for you to review at your convenience later.  YOU SHOULD EXPECT: Some feelings of bloating in the abdomen. Passage of more gas than usual.  Walking can help get rid of the air that was put into your GI tract during the procedure and reduce the bloating. If you had a lower endoscopy (such as a colonoscopy or flexible sigmoidoscopy) you may notice spotting of blood in your stool or on the toilet paper. If you underwent a bowel prep for your procedure, then you may not have a normal bowel movement for a few days.  DIET: Your first meal following the procedure should be a light meal and then it is ok to progress to your normal diet.  A half-sandwich or bowl of soup is an example of a good first meal.  Heavy or fried foods are harder to digest and may make you feel nauseous or bloated.  Likewise meals heavy in dairy and vegetables can cause extra gas to form and this can also increase the bloating.  Drink plenty of fluids but you should avoid alcoholic beverages for 24 hours.  ACTIVITY: Your care partner should take you home directly after the procedure.  You should plan to take it easy, moving slowly for the  rest of the day.  You can resume normal activity the day after the procedure however you should NOT DRIVE or use heavy machinery for 24 hours (because of the sedation medicines used during the test).    SYMPTOMS TO REPORT IMMEDIATELY: A gastroenterologist can be reached at any hour.  During normal business hours, 8:30 AM to 5:00 PM Monday through Friday, call [phone].  After hours and on weekends, please call the GI answering service at [phone] who will take a message and have the physician on call contact you.   Following lower endoscopy (colonoscopy or flexible sigmoidoscopy):  Excessive amounts of blood in the stool  Significant tenderness or worsening of abdominal pains  Swelling of the abdomen that is new, acute  Fever of 100F or higher  FOLLOW UP: If any biopsies were taken you will be contacted by phone or by letter within the next 1-3 weeks.  Call your gastroenterologist if you have not heard about the biopsies in 3 weeks.  Our staff will call the home number listed on your records the next business day following your procedure to check on you and address any questions or concerns that you may have at that time regarding the information given to you following your procedure. This is a courtesy call and so if there is no answer at the home number and we have not heard from you through the emergency physician on call, we will assume that you have returned to your regular daily activities  without incident.  SIGNATURES/CONFIDENTIALITY: You and/or your care partner have signed paperwork which will be entered into your electronic medical record.  These signatures attest to the fact that that the information above on your After Visit Summary has been reviewed and is understood.  Full responsibility of the confidentiality of this discharge information lies with you and/or your care-partner.

## 2011-09-26 NOTE — Op Note (Signed)
[facility] [address] [place], [address]   COLONOSCOPY PROCEDURE REPORT  PATIENT: Erik, [name]  MR#: [id number] BIRTHDATE: [birth year] , 56  yrs. old GENDER: Male ENDOSCOPIST: Beverley Fiedler, MD REFERRED AO:ZHYQ, Talia PROCEDURE DATE:  09/26/2011 PROCEDURE:   Colonoscopy with snare polypectomy ASA CLASS:   Class II INDICATIONS:average risk screening and first colonoscopy. MEDICATIONS: MAC sedation, administered by CRNA and Propofol (Diprivan) 240 mg IV  DESCRIPTION OF PROCEDURE:   After the risks benefits and alternatives of the procedure were thoroughly explained, informed consent was obtained.  A digital rectal exam revealed no rectal mass.   The LB [id number]  endoscope was introduced through the anus and advanced to the cecum, which was identified by both the appendix and ileocecal valve. No adverse events experienced. The quality of the prep was Suprep good  The instrument was then slowly withdrawn as the colon was fully examined.   COLON FINDINGS: A sessile polyp measuring 5 mm in size was found in the proximal transverse colon.  A polypectomy was performed with a cold snare.  The resection was complete and the polyp tissue was completely retrieved.   Multiple sessile polyps ranging between 3-83mm in size were found in the rectosigmoid colon.  Polypectomy was performed using cold snare on 5 of these polyps taken as a representative sample (Jar 2).  All resections were complete and all polyp tissue was completely retrieved.   There was moderate diverticulosis noted at the cecum, in the ascending colon, descending colon, and sigmoid colon with associated muscular hypertrophy.  Retroflexed views revealed internal hemorrhoids. The time to cecum=4 minutes 14 seconds.  Withdrawal time=16 minutes 28 seconds.  The scope was withdrawn and the procedure completed.  COMPLICATIONS: There were no complications.  ENDOSCOPIC IMPRESSION: 1.   Sessile polyp  measuring 5 mm in size was found in the proximal transverse colon; polypectomy was performed with a cold snare 2.   Multiple sessile polyps ranging between 3-70mm in size were found in the rectosigmoid colon; Polypectomy was performed using cold snare as a representative sample for 5 polyps 3.   There was moderate diverticulosis noted at the cecum, in the ascending colon, descending colon, and sigmoid colon 4.   Small internal hemorrhoids        RECOMMENDATIONS: 1.  Hold aspirin, aspirin products, and anti-inflammatory medication for 1 week. 2.  Await pathology results 3.  High fiber diet 4.  If the polyps removed today are proven to be adenomatous (pre-cancerous) polyps, you will need a colonoscopy in 3 years. Otherwise you should continue to follow colorectal cancer screening guidelines for "routine risk" patients with a colonoscopy in 10 years.  You will receive a letter within 1-2 weeks with the results of your biopsy as well as final recommendations.  Please call my office if you have not received a letter after 3 weeks.   eSigned:  Beverley Fiedler, MD 09/26/2011 9:15 AM   cc: Ruthe Mannan MD and The Patient   PATIENT NAME:  Erik, [name] MR#: [id number]

## 2011-09-26 NOTE — Progress Notes (Signed)
Patient did not have preoperative order for IV antibiotic SSI prophylaxis. (G8918)  Patient did not experience any of the following events: a burn prior to discharge; a fall within the facility; wrong site/side/patient/procedure/implant event; or a hospital transfer or hospital admission upon discharge from the facility. (G8907)  

## 2011-09-27 ENCOUNTER — Telehealth: Payer: Self-pay | Admitting: *Deleted

## 2011-09-27 NOTE — Telephone Encounter (Signed)
Left message that called for f/u 

## 2011-09-30 ENCOUNTER — Encounter: Payer: Self-pay | Admitting: Internal Medicine

## 2011-10-24 ENCOUNTER — Encounter: Payer: Self-pay | Admitting: Internal Medicine

## 2011-10-25 ENCOUNTER — Ambulatory Visit: Payer: BC Managed Care – PPO | Admitting: Internal Medicine

## 2011-11-17 ENCOUNTER — Encounter: Payer: Self-pay | Admitting: Internal Medicine

## 2011-11-18 ENCOUNTER — Ambulatory Visit (INDEPENDENT_AMBULATORY_CARE_PROVIDER_SITE_OTHER): Payer: BC Managed Care – PPO | Admitting: Internal Medicine

## 2011-11-18 ENCOUNTER — Encounter: Payer: Self-pay | Admitting: Internal Medicine

## 2011-11-18 VITALS — BP 120/76 | HR 84 | Ht 67.0 in | Wt 193.1 lb

## 2011-11-18 DIAGNOSIS — Z860101 Personal history of adenomatous and serrated colon polyps: Secondary | ICD-10-CM | POA: Insufficient documentation

## 2011-11-18 DIAGNOSIS — Z86010 Personal history of colonic polyps: Secondary | ICD-10-CM | POA: Insufficient documentation

## 2011-11-18 NOTE — Progress Notes (Signed)
Patient ID: Erik [name], male   DOB: Dec 10, 1955, 56 y.o.   MRN: [id number]  SUBJECTIVE: HPI Erik [name] is a 56 yo male with PMH of hypertension, hyperlipidemia, diverticulosis, and adenomatous colon polyps who is seen after recent colonoscopy. He is alone today.  He came direct procedure for colonoscopy on 09/26/2011.  This exam revealed one transverse colon tubular adenoma and multiple rectosigmoid polyps. A representative sample was taken from the rectosigmoid polyps as hyperplastic polyps are felt to be most likely, but pathology revealed that 3 of these were sessile serrated adenomas.  He also had moderate diverticulosis and small internal hemorrhoids.  He reports he developed the procedure with no abdominal pain or bleeding. He continues to do well from a GI standpoint and denies complaints. No nausea or vomiting. No heartburn. No abdominal pain. No change in bowel habits, rectal bleeding, diarrhea or constipation.  Review of Systems  As per history of present illness, otherwise negative   Past Medical History  Diagnosis Date  . Hyperlipidemia   . Hypertension   . Seasonal allergies   . Diverticulosis   . Adenomatous colon polyp   . Hemorrhoids     Current Outpatient Prescriptions  Medication Sig Dispense Refill  . amLODipine (NORVASC) 5 MG tablet Take 1 tablet (5 mg total) by mouth daily.  30 tablet  6  . simvastatin (ZOCOR) 80 MG tablet Take 1 tablet (80 mg total) by mouth at bedtime.  30 tablet  6  . valsartan (DIOVAN) 80 MG tablet Take three tablets by mouth once daily  90 tablet  3    No Known Allergies  Family History  Problem Relation Age of Onset  . Hypertension Brother   . Diabetes Paternal Uncle   . Breast cancer Maternal Grandmother   . Colon cancer Neg Hx   . Esophageal cancer Neg Hx   . Rectal cancer Neg Hx   . Stomach cancer Neg Hx   . Heart disease Father     History  Substance Use Topics  . Smoking status: Current Every Day Smoker -- 0.5 packs/day for .5  years    Types: Cigarettes  . Smokeless tobacco: Never Used  . Alcohol Use: Yes     Comment: rarely    OBJECTIVE: BP 120/76  Pulse 84  Ht 5\' 7"  (1.702 m)  Wt 193 lb 2 oz (87.601 kg)  BMI 30.25 kg/m2 Constitutional: Well-developed and well-nourished. No distress. Psychiatric: Normal mood and affect. Behavior is normal.   ASSESSMENT AND PLAN: 56 yo male with PMH of hypertension, hyperlipidemia, diverticulosis, and adenomatous colon polyps who is seen after recent colonoscopy  1.  History of adenomatous colon polyps -- because tubular adenomas were found in the representative sample from his rectosigmoid polypectomies, I recommended repeat flexible sigmoidoscopy to remove all rectosigmoid polyps. We discussed how often times rectosigmoid polyps are hyperplastic, and if so they do not all necessarily need to be removed. However, given that some of his rectosigmoid polyps were indeed sessile serrated adenomas, repeat procedure needs to be performed to complete polypectomy. He understands this recommendation and flex will sigmoidoscopy with sedation will be arranged. There was interval to be determined after this test. He is agreeable to proceed.

## 2011-11-18 NOTE — Patient Instructions (Addendum)
You have been given a separate informational sheet regarding your tobacco use, the importance of quitting and local resources to help you quit.  You have been scheduled for a Flexible sigmoidoscopy with propofol. Please follow written instructions given to you at your visit today.  Please pick up your prep kit at the pharmacy within the next 1-3 days. If you use inhalers (even only as needed) or a CPAP machine, please bring them with you on the day of your procedure.

## 2011-12-01 ENCOUNTER — Encounter: Payer: Self-pay | Admitting: Internal Medicine

## 2011-12-01 ENCOUNTER — Ambulatory Visit (AMBULATORY_SURGERY_CENTER): Payer: BC Managed Care – PPO | Admitting: Internal Medicine

## 2011-12-01 VITALS — BP 128/76 | HR 57 | Temp 97.0°F | Resp 16 | Ht 67.0 in | Wt 193.0 lb

## 2011-12-01 DIAGNOSIS — D126 Benign neoplasm of colon, unspecified: Secondary | ICD-10-CM

## 2011-12-01 DIAGNOSIS — Z860100 Personal history of colon polyps, unspecified: Secondary | ICD-10-CM

## 2011-12-01 MED ORDER — SODIUM CHLORIDE 0.9 % IV SOLN: 500.0000 mL | INTRAVENOUS | Status: DC

## 2011-12-01 NOTE — Patient Instructions (Addendum)
YOU HAD AN ENDOSCOPIC PROCEDURE TODAY AT THE [facility]: Refer to the procedure report that was given to you for any specific questions about what was found during the examination.  If the procedure report does not answer your questions, please call your gastroenterologist to clarify.  If you requested that your care partner not be given the details of your procedure findings, then the procedure report has been included in a sealed envelope for you to review at your convenience later.  YOU SHOULD EXPECT: Some feelings of bloating in the abdomen. Passage of more gas than usual.  Walking can help get rid of the air that was put into your GI tract during the procedure and reduce the bloating. If you had a lower endoscopy (such as a colonoscopy or flexible sigmoidoscopy) you may notice spotting of blood in your stool or on the toilet paper. If you underwent a bowel prep for your procedure, then you may not have a normal bowel movement for a few days.  DIET: Your first meal following the procedure should be a light meal and then it is ok to progress to your normal diet.  A half-sandwich or bowl of soup is an example of a good first meal.  Heavy or fried foods are harder to digest and may make you feel nauseous or bloated.  Likewise meals heavy in dairy and vegetables can cause extra gas to form and this can also increase the bloating.  Drink plenty of fluids but you should avoid alcoholic beverages for 24 hours.  ACTIVITY: Your care partner should take you home directly after the procedure.  You should plan to take it easy, moving slowly for the rest of the day.  You can resume normal activity the day after the procedure however you should NOT DRIVE or use heavy machinery for 24 hours (because of the sedation medicines used during the test).    SYMPTOMS TO REPORT IMMEDIATELY: A gastroenterologist can be reached at any hour.  During normal business hours, 8:30 AM to 5:00 PM Monday through Friday,  call [phone].  After hours and on weekends, please call the GI answering service at [phone] who will take a message and have the physician on call contact you.   Following lower endoscopy (colonoscopy or flexible sigmoidoscopy):  Excessive amounts of blood in the stool  Significant tenderness or worsening of abdominal pains  Swelling of the abdomen that is new, acute  Fever of 100F or higher  Following upper endoscopy (EGD)  Vomiting of blood or coffee ground material  New chest pain or pain under the shoulder blades  Painful or persistently difficult swallowing  New shortness of breath  Fever of 100F or higher  Black, tarry-looking stools  FOLLOW UP: If any biopsies were taken you will be contacted by phone or by letter within the next 1-3 weeks.  Call your gastroenterologist if you have not heard about the biopsies in 3 weeks.  Our staff will call the home number listed on your records the next business day following your procedure to check on you and address any questions or concerns that you may have at that time regarding the information given to you following your procedure. This is a courtesy call and so if there is no answer at the home number and we have not heard from you through the emergency physician on call, we will assume that you have returned to your regular daily activities without incident.  SIGNATURES/CONFIDENTIALITY: You and/or your care   partner have signed paperwork which will be entered into your electronic medical record.  These signatures attest to the fact that that the information above on your After Visit Summary has been reviewed and is understood.  Full responsibility of the confidentiality of this discharge information lies with you and/or your care-partner.  

## 2011-12-01 NOTE — Progress Notes (Signed)
Over thirty polyps were removed.  Cold & hot snare.  Unable to tell how many polyps were removed.  Some were fragmented.  Path bottle unable to tell number of polyps retrieved.  Pt tolerated the flex sig very well. Maw

## 2011-12-01 NOTE — Progress Notes (Signed)
Patient did not have preoperative order for IV antibiotic SSI prophylaxis. (G8918)  Patient did not experience any of the following events: a burn prior to discharge; a fall within the facility; wrong site/side/patient/procedure/implant event; or a hospital transfer or hospital admission upon discharge from the facility. (G8907)  

## 2011-12-01 NOTE — Op Note (Signed)
[facility] [address] [place], [address]   FLEXIBLE SIGMOIDOSCOPY PROCEDURE REPORT  PATIENT: Erik, [name]  MR#: [id number] BIRTHDATE: 06-May-1955 , 56  yrs. old GENDER: Male ENDOSCOPIST: Beverley Fiedler, MD PROCEDURE DATE:  12/01/2011 PROCEDURE:   Sigmoidoscopy with hot and cold snare polypectomy ASA CLASS:   Class II INDICATIONS: history of adenomatous colon polyps, for therapy of known rectal polyps. MEDICATIONS: MAC sedation, administered by CRNA and propofol (Diprivan) 450mg  IV  DESCRIPTION OF PROCEDURE:   After the risks benefits and alternatives of the procedure were thoroughly explained, informed consent was obtained.  revealed no abnormalities of the rectum. The [id number]  endoscope was introduced through the anus and advanced to the sigmoid colon , limited by No adverse events experienced.   The quality of the prep was adequate .  The instrument was then slowly withdrawn as the mucosa was fully examined.     COLON FINDINGS: Multiple sessile polyps ranging between 3-7 mm in size were found in the rectosigmoid colon.  Polypectomy was performed on greater than 30 of these polyps using cold snare and with hot snare.  All resections were complete and all polyp tissue was completely retrieved.  Mild diverticulosis was noted in the sigmoid colon.  Small internal hemorrhoids were found.  The scope was then withdrawn from the patient and the procedure terminated.  COMPLICATIONS: There were no complications.  ENDOSCOPIC IMPRESSION: 1.   Multiple sessile polyps (> 30) ranging between 3-7mm in size were found in the rectosigmoid colon; Polypectomy was performed using cold snare and with hot snare 2.   Mild diverticulosis was noted in the sigmoid colon 3.   Small internal hemorrhoids  RECOMMENDATIONS: 1.  Await biopsy results 2.  Would repeat full colonoscopy in 1 year 3.  If these polyps return as adenomatous, then consider referral  to medical genetics for investigation for polyposis syndrome.  [ eSigned:  Beverley Fiedler, MD 12/01/2011 9:19 AM   XB:JYNW, Jovita Gamma MD The Patient

## 2011-12-02 ENCOUNTER — Telehealth: Payer: Self-pay | Admitting: *Deleted

## 2011-12-02 NOTE — Telephone Encounter (Signed)
  Follow up Call-  Call back number 12/01/2011 09/26/2011  Post procedure Call Back phone  # [phone]  Permission to leave phone message Yes Yes     Patient questions:  Do you have a fever, pain , or abdominal swelling? no Pain Score  0 *  Have you tolerated food without any problems? yes  Have you been able to return to your normal activities? yes  Do you have any questions about your discharge instructions: Diet   no Medications  no Follow up visit  no  Do you have questions or concerns about your Care? no  Actions: * If pain score is 4 or above: No action needed, pain <4.

## 2011-12-07 ENCOUNTER — Encounter: Payer: Self-pay | Admitting: Internal Medicine

## 2012-09-17 ENCOUNTER — Encounter: Payer: Self-pay | Admitting: Internal Medicine

## 2012-12-17 ENCOUNTER — Ambulatory Visit (INDEPENDENT_AMBULATORY_CARE_PROVIDER_SITE_OTHER): Payer: BC Managed Care – PPO | Admitting: Internal Medicine

## 2012-12-17 ENCOUNTER — Encounter: Payer: Self-pay | Admitting: Internal Medicine

## 2012-12-17 ENCOUNTER — Ambulatory Visit: Payer: BC Managed Care – PPO | Admitting: Internal Medicine

## 2012-12-17 ENCOUNTER — Other Ambulatory Visit: Payer: BC Managed Care – PPO

## 2012-12-17 VITALS — BP 132/84 | HR 80 | Temp 98.2°F | Resp 20 | Ht 67.0 in | Wt 194.8 lb

## 2012-12-17 DIAGNOSIS — F17200 Nicotine dependence, unspecified, uncomplicated: Secondary | ICD-10-CM

## 2012-12-17 DIAGNOSIS — E785 Hyperlipidemia, unspecified: Secondary | ICD-10-CM

## 2012-12-17 DIAGNOSIS — I10 Essential (primary) hypertension: Secondary | ICD-10-CM

## 2012-12-17 DIAGNOSIS — F17210 Nicotine dependence, cigarettes, uncomplicated: Secondary | ICD-10-CM

## 2012-12-17 MED ORDER — VALSARTAN 80 MG PO TABS: ORAL_TABLET | ORAL | Status: DC

## 2012-12-17 MED ORDER — BUPROPION HCL ER (XL) 150 MG PO TB24: 150.0000 mg | ORAL_TABLET | Freq: Every day | ORAL | Status: DC

## 2012-12-17 MED ORDER — SIMVASTATIN 80 MG PO TABS: 80.0000 mg | ORAL_TABLET | Freq: Every day | ORAL | Status: DC

## 2012-12-17 MED ORDER — AMLODIPINE BESYLATE 5 MG PO TABS: 5.0000 mg | ORAL_TABLET | Freq: Every day | ORAL | Status: DC

## 2012-12-17 NOTE — Patient Instructions (Signed)
Smoking Cessation, Tips for Success YOU CAN QUIT SMOKING If you are ready to quit smoking, congratulations! You have chosen to help yourself be healthier. Cigarettes bring nicotine, tar, carbon monoxide, and other irritants into your body. Your lungs, heart, and blood vessels will be able to work better without these poisons. There are many different ways to quit smoking. Nicotine gum, nicotine patches, a nicotine inhaler, or nicotine nasal spray can help with physical craving. Hypnosis, support groups, and medicines help break the habit of smoking. Here are some tips to help you quit for good.  Throw away all cigarettes.  Clean and remove all ashtrays from your home, work, and car.  On a card, write down your reasons for quitting. Carry the card with you and read it when you get the urge to smoke.  Cleanse your body of nicotine. Drink enough water and fluids to keep your urine clear or pale yellow. Do this after quitting to flush the nicotine from your body.  Learn to predict your moods. Do not let a bad situation be your excuse to have a cigarette. Some situations in your life might tempt you into wanting a cigarette.  Never have "just one" cigarette. It leads to wanting another and another. Remind yourself of your decision to quit.  Change habits associated with smoking. If you smoked while driving or when feeling stressed, try other activities to replace smoking. Stand up when drinking your coffee. Brush your teeth after eating. Sit in a different chair when you read the paper. Avoid alcohol while trying to quit, and try to drink fewer caffeinated beverages. Alcohol and caffeine may urge you to smoke.  Avoid foods and drinks that can trigger a desire to smoke, such as sugary or spicy foods and alcohol.  Ask people who smoke not to smoke around you.  Have something planned to do right after eating or having a cup of coffee. Take a walk or exercise to perk you up. This will help to keep you  from overeating.  Try a relaxation exercise to calm you down and decrease your stress. Remember, you may be tense and nervous for the first 2 weeks after you quit, but this will pass.  Find new activities to keep your hands busy. Play with a pen, coin, or rubber band. Doodle or draw things on paper.  Brush your teeth right after eating. This will help cut down on the craving for the taste of tobacco after meals. You can try mouthwash, too.  Use oral substitutes, such as lemon drops, carrots, a cinnamon stick, or chewing gum, in place of cigarettes. Keep them handy so they are available when you have the urge to smoke.  When you have the urge to smoke, try deep breathing.  Designate your home as a nonsmoking area.  If you are a heavy smoker, ask your caregiver about a prescription for nicotine chewing gum. It can ease your withdrawal from nicotine.  Reward yourself. Set aside the cigarette money you save and buy yourself something nice.  Look for support from others. Join a support group or smoking cessation program. Ask someone at home or at work to help you with your plan to quit smoking.  Always ask yourself, "Do I need this cigarette or is this just a reflex?" Tell yourself, "Today, I choose not to smoke," or "I do not want to smoke." You are reminding yourself of your decision to quit, even if you do smoke a cigarette. HOW WILL I FEEL WHEN   I QUIT SMOKING?  The benefits of not smoking start within days of quitting.  You may have symptoms of withdrawal because your body is used to nicotine (the addictive substance in cigarettes). You may crave cigarettes, be irritable, feel very hungry, cough often, get headaches, or have difficulty concentrating.  The withdrawal symptoms are only temporary. They are strongest when you first quit but will go away within 10 to 14 days.  When withdrawal symptoms occur, stay in control. Think about your reasons for quitting. Remind yourself that these are  signs that your body is healing and getting used to being without cigarettes.  Remember that withdrawal symptoms are easier to treat than the major diseases that smoking can cause.  Even after the withdrawal is over, expect periodic urges to smoke. However, these cravings are generally short-lived and will go away whether you smoke or not. Do not smoke!  If you relapse and smoke again, do not lose hope. Most smokers quit 3 times before they are successful.  If you relapse, do not give up! Plan ahead and think about what you will do the next time you get the urge to smoke. LIFE AS A NONSMOKER: MAKE IT FOR A MONTH, MAKE IT FOR LIFE Day 1: Hang this page where you will see it every day. Day 2: Get rid of all ashtrays, matches, and lighters. Day 3: Drink water. Breathe deeply between sips. Day 4: Avoid places with smoke-filled air, such as bars, clubs, or the smoking section of restaurants. Day 5: Keep track of how much money you save by not smoking. Day 6: Avoid boredom. Keep a good book with you or go to the movies. Day 7: Reward yourself! One week without smoking! Day 8: Make a dental appointment to get your teeth cleaned. Day 9: Decide how you will turn down a cigarette before it is offered to you. Day 10: Review your reasons for quitting. Day 11: Distract yourself. Stay active to keep your mind off smoking and to relieve tension. Take a walk, exercise, read a book, do a crossword puzzle, or try a new hobby. Day 12: Exercise. Get off the bus before your stop or use stairs instead of escalators. Day 13: Call on friends for support and encouragement. Day 14: Reward yourself! Two weeks without smoking! Day 15: Practice deep breathing exercises. Day 16: Bet a friend that you can stay a nonsmoker. Day 17: Ask to sit in nonsmoking sections of restaurants. Day 18: Hang up "No Smoking" signs. Day 19: Think of yourself as a nonsmoker. Day 20: Each morning, tell yourself you will not smoke. Day  21: Reward yourself! Three weeks without smoking! Day 22: Think of smoking in negative ways. Remember how it stains your teeth, gives you bad breath, and leaves you short of breath. Day 23: Eat a nutritious breakfast. Day 24:Do not relive your days as a smoker. Day 25: Hold a pencil in your hand when talking on the telephone. Day 26: Tell all your friends you do not smoke. Day 27: Think about how much better food tastes. Day 28: Remember, one cigarette is one too many. Day 29: Take up a hobby that will keep your hands busy. Day 30: Congratulations! One month without smoking! Give yourself a big reward. Your caregiver can direct you to community resources or hospitals for support, which may include:  Group support.  Education.  Hypnosis.  Subliminal therapy. Document Released: 09/25/2003 Document Revised: 03/21/2011 Document Reviewed: 06/14/2012 [facility] Patient Information 2014 [place], [place]. You  Can Quit Smoking If you are ready to quit smoking or are thinking about it, congratulations! You have chosen to help yourself be healthier and live longer! There are lots of different ways to quit smoking. Nicotine gum, nicotine patches, a nicotine inhaler, or nicotine nasal spray can help with physical craving. Hypnosis, support groups, and medicines help break the habit of smoking. TIPS TO GET OFF AND STAY OFF CIGARETTES  Learn to predict your moods. Do not let a bad situation be your excuse to have a cigarette. Some situations in your life might tempt you to have a cigarette.  Ask friends and co-workers not to smoke around you.  Make your home smoke-free.  Never have "just one" cigarette. It leads to wanting another and another. Remind yourself of your decision to quit.  On a card, make a list of your reasons for not smoking. Read it at least the same number of times a day as you have a cigarette. Tell yourself everyday, "I do not want to smoke. I choose not to smoke."  Ask someone at  home or work to help you with your plan to quit smoking.  Have something planned after you eat or have a cup of coffee. Take a walk or get other exercise to perk you up. This will help to keep you from overeating.  Try a relaxation exercise to calm you down and decrease your stress. Remember, you may be tense and nervous the first two weeks after you quit. This will pass.  Find new activities to keep your hands busy. Play with a pen, coin, or rubber band. Doodle or draw things on paper.  Brush your teeth right after eating. This will help cut down the craving for the taste of tobacco after meals. You can try mouthwash too.  Try gum, breath mints, or diet candy to keep something in your mouth. IF YOU SMOKE AND WANT TO QUIT:  Do not stock up on cigarettes. Never buy a carton. Wait until one pack is finished before you buy another.  Never carry cigarettes with you at work or at home.  Keep cigarettes as far away from you as possible. Leave them with someone else.  Never carry matches or a lighter with you.  Ask yourself, "Do I need this cigarette or is this just a reflex?"  Bet with someone that you can quit. Put cigarette money in a piggy bank every morning. If you smoke, you give up the money. If you do not smoke, by the end of the week, you keep the money.  Keep trying. It takes 21 days to change a habit!  Talk to your doctor about using medicines to help you quit. These include nicotine replacement gum, lozenges, or skin patches. Document Released: 10/23/2008 Document Revised: 03/21/2011 Document Reviewed: 10/23/2008 [facility] Patient Information 2014 [place], [place].

## 2012-12-17 NOTE — Progress Notes (Signed)
Subjective:    Patient ID: Erik [name], male    DOB: 10-Jan-1956, 57 y.o.   MRN: [id number]  HPI  Pt presents to the clinic today for medication refills. He is on Novasc 5 mg and Diovan mg for HTN. His blood pressure his well controlled. He is also taking zocor 80 mg for hyperlipidemia. He denies myalgias or joint pain. His last labs 07/2011 were reviewed.  Additionally, he has some questions about smoking cessation. He is motivated to quit. He has quit in the past- cold [place] for 12 years then restarted. He does not think that method will work for him this time. He has researched the different options. He would like to try wellbutrin.  Review of Systems  Past Medical History  Diagnosis Date  . Hyperlipidemia   . Hypertension   . Seasonal allergies   . Diverticulosis   . Adenomatous colon polyp   . Hemorrhoids     Current Outpatient Prescriptions  Medication Sig Dispense Refill  . amLODipine (NORVASC) 5 MG tablet Take 1 tablet (5 mg total) by mouth daily.  30 tablet  6  . simvastatin (ZOCOR) 80 MG tablet Take 1 tablet (80 mg total) by mouth at bedtime.  30 tablet  6  . valsartan (DIOVAN) 80 MG tablet Take three tablets by mouth once daily  90 tablet  3   No current facility-administered medications for this visit.    No Known Allergies  Family History  Problem Relation Age of Onset  . Hypertension Brother   . Diabetes Paternal Uncle   . Breast cancer Maternal Grandmother   . Colon cancer Neg Hx   . Esophageal cancer Neg Hx   . Rectal cancer Neg Hx   . Stomach cancer Neg Hx   . Heart disease Father     History   Social History  . Marital Status: Married    Spouse Name: N/A    Number of Children: 2  . Years of Education: N/A   Occupational History  . Truck drive    Social History Main Topics  . Smoking status: Current Every Day Smoker -- 0.50 packs/day for .5 years    Types: Cigarettes  . Smokeless tobacco: Never Used  . Alcohol Use: Yes     Comment: rarely   . Drug Use: No  . Sexual Activity: Not on file   Other Topics Concern  . Not on file   Social History Narrative  . No narrative on file     Constitutional: Denies fever, malaise, fatigue, headache or abrupt weight changes.  Respiratory: Denies difficulty breathing, shortness of breath, cough or sputum production.   Cardiovascular: Denies chest pain, chest tightness, palpitations or swelling in the hands or feet.  Musculoskeletal: Denies decrease in range of motion, difficulty with gait, muscle pain or joint pain and swelling.     No other specific complaints in a complete review of systems (except as listed in HPI above).     Objective:   Physical Exam   BP 132/84  Pulse 80  Temp(Src) 98.2 F (36.8 C) (Oral)  Resp 20  Ht 5\' 7"  (1.702 m)  Wt 194 lb 12 oz (88.338 kg)  BMI 30.49 kg/m2 Wt Readings from Last 3 Encounters:  12/17/12 194 lb 12 oz (88.338 kg)  12/01/11 193 lb (87.544 kg)  11/18/11 193 lb 2 oz (87.601 kg)    General: Appears his stated age, well developed, well nourished in NAD. Cardiovascular: Normal rate and rhythm. S1,S2 noted.  No murmur, rubs or gallops noted. No JVD or BLE edema. No carotid bruits noted. Pulmonary/Chest: Normal effort and positive vesicular breath sounds. No respiratory distress. No wheezes, rales or ronchi noted.  Musculoskeletal: Normal range of motion. No signs of joint swelling. No difficulty with gait.    BMET    Component Value Date/Time   NA 139 07/27/2011 0908   K 4.4 07/27/2011 0908   CL 104 07/27/2011 0908   CO2 26 07/27/2011 0908   GLUCOSE 97 07/27/2011 0908   BUN 11 07/27/2011 0908   CREATININE 1.2 07/27/2011 0908   CALCIUM 9.3 07/27/2011 0908   GFRNONAA 81.97 06/16/2009 0821   GFRAA 75 11/19/2007 1057    Lipid Panel     Component Value Date/Time   CHOL 200 07/27/2011 0908   TRIG 81.0 07/27/2011 0908   HDL 58.50 07/27/2011 0908   CHOLHDL 3 07/27/2011 0908   VLDL 16.2 07/27/2011 0908   LDLCALC 125* 07/27/2011 0908     CBC    Component Value Date/Time   WBC 6.4 07/27/2011 0908   RBC 4.61 07/27/2011 0908   HGB 13.6 07/27/2011 0908   HCT 41.4 07/27/2011 0908   PLT 168.0 07/27/2011 0908   MCV 89.8 07/27/2011 0908   MCHC 32.8 07/27/2011 0908   RDW 14.1 07/27/2011 0908   LYMPHSABS 2.0 07/27/2011 0908   MONOABS 0.5 07/27/2011 0908   EOSABS 0.1 07/27/2011 0908   BASOSABS 0.0 07/27/2011 0908    Hgb A1C No results found for this basename: HGBA1C        Assessment & Plan:   Smoking cessation counseling:  Counseled on different methods of smoking cessation approx 3-5 minutes.  eRx for Wellbutrin 150 mg daily Pick a quit date and stick to it! Education provided on tips for smoking cessation success  RTC in 3 months

## 2012-12-17 NOTE — Assessment & Plan Note (Signed)
Will continue current medication Future lipid profile ordered (Pt is not fasting today)

## 2012-12-17 NOTE — Assessment & Plan Note (Signed)
Well controlled Continue Norvasc and Diovan Ordered future CMP

## 2012-12-17 NOTE — Progress Notes (Signed)
Pre-visit discussion using our clinic review tool. No additional management support is needed unless otherwise documented below in the visit note.  

## 2013-03-18 ENCOUNTER — Ambulatory Visit (INDEPENDENT_AMBULATORY_CARE_PROVIDER_SITE_OTHER): Payer: BC Managed Care – PPO | Admitting: Family Medicine

## 2013-03-18 ENCOUNTER — Telehealth: Payer: Self-pay | Admitting: Family Medicine

## 2013-03-18 ENCOUNTER — Encounter: Payer: Self-pay | Admitting: Family Medicine

## 2013-03-18 VITALS — BP 148/92 | HR 60 | Temp 97.7°F | Ht 66.5 in | Wt 186.0 lb

## 2013-03-18 DIAGNOSIS — E785 Hyperlipidemia, unspecified: Secondary | ICD-10-CM

## 2013-03-18 DIAGNOSIS — F17200 Nicotine dependence, unspecified, uncomplicated: Secondary | ICD-10-CM

## 2013-03-18 DIAGNOSIS — I10 Essential (primary) hypertension: Secondary | ICD-10-CM

## 2013-03-18 DIAGNOSIS — E782 Mixed hyperlipidemia: Secondary | ICD-10-CM

## 2013-03-18 LAB — COMPREHENSIVE METABOLIC PANEL
ALBUMIN: 3.9 g/dL (ref 3.5–5.2)
ALT: 15 U/L (ref 0–53)
AST: 14 U/L (ref 0–37)
Alkaline Phosphatase: 70 U/L (ref 39–117)
BUN: 12 mg/dL (ref 6–23)
CALCIUM: 9.5 mg/dL (ref 8.4–10.5)
CO2: 24 mEq/L (ref 19–32)
Chloride: 107 mEq/L (ref 96–112)
Creatinine, Ser: 1.4 mg/dL (ref 0.4–1.5)
GFR: 69.9 mL/min (ref >60.00)
Glucose, Bld: 93 mg/dL (ref 70–99)
POTASSIUM: 4.2 meq/L (ref 3.5–5.1)
Sodium: 139 mEq/L (ref 135–145)
Total Bilirubin: 0.5 mg/dL (ref 0.3–1.2)
Total Protein: 7.6 g/dL (ref 6.0–8.3)

## 2013-03-18 LAB — LIPID PANEL
CHOL/HDL RATIO: 4
CHOLESTEROL: 251 mg/dL — AB (ref 0–200)
HDL: 59.3 mg/dL (ref >39.00)
LDL Cholesterol: 173 mg/dL — ABNORMAL HIGH (ref 0–99)
Triglycerides: 94 mg/dL (ref 0.0–149.0)
VLDL: 18.8 mg/dL (ref 0.0–40.0)

## 2013-03-18 MED ORDER — VARENICLINE TARTRATE 0.5 MG X 11 & 1 MG X 42 PO MISC
ORAL | Status: DC
Start: 2013-03-18 — End: 2013-09-26

## 2013-03-18 MED ORDER — AMLODIPINE BESYLATE 5 MG PO TABS: 5.0000 mg | ORAL_TABLET | Freq: Every day | ORAL | Status: DC

## 2013-03-18 MED ORDER — VALSARTAN 80 MG PO TABS: ORAL_TABLET | ORAL | Status: DC

## 2013-03-18 MED ORDER — SIMVASTATIN 80 MG PO TABS: 80.0000 mg | ORAL_TABLET | Freq: Every day | ORAL | Status: DC

## 2013-03-18 NOTE — Patient Instructions (Signed)
Good to see. Pick a quit date.  We are starting Chantix.

## 2013-03-18 NOTE — Telephone Encounter (Signed)
Relevant patient education assigned to patient using Emmi. ° °

## 2013-03-18 NOTE — Assessment & Plan Note (Signed)
D/c wellbutrin. Start Chantix- discussed possible Side effects. Follow up in 3 months. The patient indicates understanding of these issues and agrees with the plan.

## 2013-03-18 NOTE — Progress Notes (Signed)
Pre visit review using our clinic review tool, if applicable. No additional management support is needed unless otherwise documented below in the visit note. 

## 2013-03-18 NOTE — Assessment & Plan Note (Signed)
A little elevated but has not taken his medication today.  Has been controlled at previous OV. Advised to take his medication as soon as he gets home. The patient indicates understanding of these issues and agrees with the plan.

## 2013-03-18 NOTE — Progress Notes (Signed)
   Subjective:   Patient ID: Erik [name], male    DOB: 1955/04/02, 58 y.o.   MRN: [id number]  Erik [name] is a pleasant 58 y.o. year old year old male who presents to clinic today with Follow-up  on 03/18/2013  HPI: Saw Erik [name] while I was out on maternity leave 3 months ago.  Smoking cessation- ready to quit.  Has smoked over 20 years- quit once prior to that- cold [place].  Now has a one year old grand daughter, Erik [name], and wants to quit for her. Rollene Fare started him on Zyban- has noted no improvement in his cravings.  HLD- on Zocor 80 mg daily. Denies myalgias. Fasting today for labs.  Lab Results  Component Value Date   CHOL 200 07/27/2011   HDL 58.50 07/27/2011   LDLCALC 125* 07/27/2011   LDLDIRECT 121.3 06/21/2010   TRIG 81.0 07/27/2011   CHOLHDL 3 07/27/2011   BP a little elevated today.  Has not taken his medication yet today. BP Readings from Last 3 Encounters:  03/18/13 148/92  12/17/12 132/84  12/01/11 128/76   Patient Active Problem List   Diagnosis Date Noted  . Hx of adenomatous colonic polyps 11/18/2011  . 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 NICOTINE ADDICTION 05/10/2007  . HYPERLIPIDEMIA 12/18/2006  . HYPERTENSION 12/18/2006   Past Medical History  Diagnosis Date  . Hyperlipidemia   . Hypertension   . Seasonal allergies   . Diverticulosis   . Adenomatous colon polyp   . Hemorrhoids    Past Surgical History  Procedure Laterality Date  . Shoulder surgery  [place]    right   History  Substance Use Topics  . Smoking status: Current Every Day Smoker -- 0.50 packs/day for .5 years    Types: Cigarettes  . Smokeless tobacco: Never Used  . Alcohol Use: Yes     Comment: rarely   Family History  Problem Relation Age of Onset  . Hypertension Brother   . Diabetes Paternal Uncle   . Breast cancer Maternal Grandmother   . Colon cancer Neg Hx   . Esophageal cancer Neg Hx   . Rectal cancer Neg Hx   . Stomach cancer Neg Hx   . Heart disease Father     No Known Allergies Current Outpatient Prescriptions on File Prior to Visit  Medication Sig Dispense Refill  . buPROPion (WELLBUTRIN XL) 150 MG 24 hr tablet Take 1 tablet (150 mg total) by mouth daily.  30 tablet  2   No current facility-administered medications on file prior to visit.   The PMH, PSH, Social History, Family History, Medications, and allergies have been reviewed in [facility], and have been updated if relevant.   Review of Systems     Objective:    BP 148/92  Pulse 60  Temp(Src) 97.7 F (36.5 C) (Oral)  Ht 5' 6.5" (1.689 m)  Wt 186 lb (84.369 kg)  BMI 29.57 kg/m2  SpO2 94%   Physical Exam   See HPI Denies any anxiety or depression     Assessment & Plan:   HTN (hypertension) - Plan: valsartan (DIOVAN) 80 MG tablet, amLODipine (NORVASC) 5 MG tablet, Comprehensive metabolic panel  Other and unspecified hyperlipidemia - Plan: simvastatin (ZOCOR) 80 MG tablet, Lipid panel, Comprehensive metabolic panel  NICOTINE ADDICTION  HYPERLIPIDEMIA No Follow-up on file.

## 2013-03-18 NOTE — Assessment & Plan Note (Signed)
Recheck labs today. Continue zocor. 

## 2013-03-19 ENCOUNTER — Ambulatory Visit: Payer: BC Managed Care – PPO | Admitting: Family Medicine

## 2013-03-19 ENCOUNTER — Telehealth: Payer: Self-pay | Admitting: Family Medicine

## 2013-03-19 NOTE — Telephone Encounter (Signed)
Relevant patient education assigned to patient using Emmi. ° °

## 2013-04-29 ENCOUNTER — Encounter: Payer: Self-pay | Admitting: Internal Medicine

## 2013-05-05 ENCOUNTER — Other Ambulatory Visit: Payer: Self-pay | Admitting: Internal Medicine

## 2013-05-21 ENCOUNTER — Other Ambulatory Visit: Payer: Self-pay | Admitting: Internal Medicine

## 2013-05-22 NOTE — Telephone Encounter (Signed)
Last filled and OV 03/18/13 #90 and please advise

## 2013-09-02 ENCOUNTER — Encounter: Payer: Self-pay | Admitting: Family Medicine

## 2013-09-02 ENCOUNTER — Ambulatory Visit (INDEPENDENT_AMBULATORY_CARE_PROVIDER_SITE_OTHER): Payer: BC Managed Care – PPO | Admitting: Family Medicine

## 2013-09-02 ENCOUNTER — Telehealth: Payer: Self-pay | Admitting: Family Medicine

## 2013-09-02 VITALS — BP 148/94 | HR 75 | Temp 98.1°F | Wt 188.5 lb

## 2013-09-02 DIAGNOSIS — I10 Essential (primary) hypertension: Secondary | ICD-10-CM

## 2013-09-02 DIAGNOSIS — F17200 Nicotine dependence, unspecified, uncomplicated: Secondary | ICD-10-CM

## 2013-09-02 DIAGNOSIS — E782 Mixed hyperlipidemia: Secondary | ICD-10-CM

## 2013-09-02 DIAGNOSIS — S61209A Unspecified open wound of unspecified finger without damage to nail, initial encounter: Secondary | ICD-10-CM

## 2013-09-02 DIAGNOSIS — N529 Male erectile dysfunction, unspecified: Secondary | ICD-10-CM

## 2013-09-02 DIAGNOSIS — E785 Hyperlipidemia, unspecified: Secondary | ICD-10-CM

## 2013-09-02 DIAGNOSIS — Z86010 Personal history of colonic polyps: Secondary | ICD-10-CM

## 2013-09-02 DIAGNOSIS — S61219A Laceration without foreign body of unspecified finger without damage to nail, initial encounter: Secondary | ICD-10-CM | POA: Insufficient documentation

## 2013-09-02 DIAGNOSIS — Z23 Encounter for immunization: Secondary | ICD-10-CM

## 2013-09-02 MED ORDER — AMLODIPINE BESYLATE 5 MG PO TABS: 5.0000 mg | ORAL_TABLET | Freq: Every day | ORAL | Status: DC

## 2013-09-02 MED ORDER — SIMVASTATIN 20 MG PO TABS: 20.0000 mg | ORAL_TABLET | Freq: Every day | ORAL | Status: DC

## 2013-09-02 MED ORDER — VALSARTAN 80 MG PO TABS: ORAL_TABLET | ORAL | Status: DC

## 2013-09-02 NOTE — Assessment & Plan Note (Signed)
Recheck labs today. 

## 2013-09-02 NOTE — Assessment & Plan Note (Signed)
Stable on current rx. No changes made today. 

## 2013-09-02 NOTE — Patient Instructions (Signed)

## 2013-09-02 NOTE — Progress Notes (Signed)
Subjective:   Patient ID: Erik [name], male    DOB: December 05, 1955, 58 y.o.   MRN: [id number]  Erik [name] is a pleasant 58 y.o. year old male who presents to clinic today for med refills with additional complaint of finger Laceration  on 09/02/2013  HPI:  Finger laceration- 3 days ago, picked up something in his garage and it cut him.  He is not sure what it was.  Cleaned the wound with peroxide and then put neosporin on it and has been changing the bandaid daily. No drainage from the site. No fevers.  Td 07/11/05  Smoking cessation- still has not quit.  No longer taking Chantix or wellbutrin.  HLD- on Zocor 80 mg daily. Denies myalgias.  Lab Results  Component Value Date   CHOL 251* 03/18/2013   HDL 59.30 03/18/2013   LDLCALC 173* 03/18/2013   LDLDIRECT 121.3 06/21/2010   TRIG 94.0 03/18/2013   CHOLHDL 4 03/18/2013   BP a little elevated today.  Has not taken his medication yet today. BP Readings from Last 3 Encounters:  09/02/13 148/94  03/18/13 148/92  12/17/12 132/84   Patient Active Problem List   Diagnosis Date Noted  . Finger laceration 09/02/2013  . Hx of adenomatous colonic polyps 11/18/2011  . 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 NICOTINE ADDICTION 05/10/2007  . HYPERLIPIDEMIA 12/18/2006  . HYPERTENSION 12/18/2006   Past Medical History  Diagnosis Date  . Hyperlipidemia   . Hypertension   . Seasonal allergies   . Diverticulosis   . Adenomatous colon polyp   . Hemorrhoids    Past Surgical History  Procedure Laterality Date  . Shoulder surgery  [place]    right   History  Substance Use Topics  . Smoking status: Current Every Day Smoker -- 0.50 packs/day for .5 years    Types: Cigarettes  . Smokeless tobacco: Never Used  . Alcohol Use: Yes     Comment: rarely   Family History  Problem Relation Age of Onset  . Hypertension Brother   . Diabetes Paternal Uncle   . Breast cancer Maternal Grandmother   . Colon cancer Neg Hx   . Esophageal cancer Neg  Hx   . Rectal cancer Neg Hx   . Stomach cancer Neg Hx   . Heart disease Father    No Known Allergies Current Outpatient Prescriptions on File Prior to Visit  Medication Sig Dispense Refill  . buPROPion (WELLBUTRIN XL) 150 MG 24 hr tablet Take 1 tablet (150 mg total) by mouth daily.  30 tablet  2  . varenicline (CHANTIX STARTING MONTH PAK) 0.5 MG X 11 & 1 MG X 42 tablet Take one 0.5 mg tablet by mouth once daily for 3 days, then increase to one 0.5 mg tablet twice daily for 4 days, then increase to one 1 mg tablet twice daily.  53 tablet  0   No current facility-administered medications on file prior to visit.   The PMH, PSH, Social History, Family History, Medications, and allergies have been reviewed in [facility], and have been updated if relevant.   Review of Systems See HPI No nausea or vomiting No hemoptysis No CP or SOB No LE edema    Objective:    BP 148/94  Pulse 75  Temp(Src) 98.1 F (36.7 C) (Oral)  Wt 188 lb 8 oz (85.503 kg)  SpO2 95%   Physical Exam  Nursing note and vitals reviewed. Constitutional: He appears well-developed and well-nourished. No distress.  HENT:  Head: Normocephalic.  Eyes: Pupils are equal, round, and reactive to light.  Neck: Normal range of motion.  Pulmonary/Chest: Effort normal and breath sounds normal.  Skin:  4th  finger of left hand- 1.5 cm laceration on dorsal surface, no drainage, already starting to heal.  Psychiatric: He has a normal mood and affect. His behavior is normal. Thought content normal.         Assessment & Plan:   HYPERLIPIDEMIA - Plan: Comprehensive metabolic panel, Lipid panel  NICOTINE ADDICTION  HYPERTENSION  ERECTILE DYSFUNCTION, ORGANIC  Hx of adenomatous colonic polyps  Other and unspecified hyperlipidemia - Plan: simvastatin (ZOCOR) 20 MG tablet  Need for prophylactic vaccination with combined diphtheria-tetanus-pertussis (DTP) vaccine - Plan: Td vaccine preservative free greater than or equal to  58yo IM No Follow-up on file.

## 2013-09-02 NOTE — Assessment & Plan Note (Signed)
New- healing on its own.  Discussed proper care of wound and red flag symptoms requiring follow up- see AVS. The patient indicates understanding of these issues and agrees with the plan.

## 2013-09-02 NOTE — Telephone Encounter (Signed)
Relevant patient education assigned to patient using Emmi. ° °

## 2013-09-02 NOTE — Progress Notes (Signed)
Pre visit review using our clinic review tool, if applicable. No additional management support is needed unless otherwise documented below in the visit note. 

## 2013-09-02 NOTE — Assessment & Plan Note (Signed)
Deteriorated. Smoking again and not ready to quit.

## 2013-09-03 LAB — COMPREHENSIVE METABOLIC PANEL
ALT: 20 U/L (ref 0–53)
AST: 18 U/L (ref 0–37)
Albumin: 3.8 g/dL (ref 3.5–5.2)
Alkaline Phosphatase: 85 U/L (ref 39–117)
BUN: 14 mg/dL (ref 6–23)
CO2: 28 mEq/L (ref 19–32)
CREATININE: 1.2 mg/dL (ref 0.4–1.5)
Calcium: 9.6 mg/dL (ref 8.4–10.5)
Chloride: 104 mEq/L (ref 96–112)
GFR: 78.44 mL/min (ref >60.00)
GLUCOSE: 80 mg/dL (ref 70–99)
Potassium: 4.6 mEq/L (ref 3.5–5.1)
Sodium: 138 mEq/L (ref 135–145)
Total Bilirubin: 0.3 mg/dL (ref 0.2–1.2)
Total Protein: 7.8 g/dL (ref 6.0–8.3)

## 2013-09-03 LAB — LIPID PANEL
Cholesterol: 241 mg/dL — ABNORMAL HIGH (ref 0–200)
HDL: 57.3 mg/dL (ref >39.00)
LDL Cholesterol: 151 mg/dL — ABNORMAL HIGH (ref 0–99)
NonHDL: 183.7
Total CHOL/HDL Ratio: 4
Triglycerides: 165 mg/dL — ABNORMAL HIGH (ref 0.0–149.0)
VLDL: 33 mg/dL (ref 0.0–40.0)

## 2013-09-26 ENCOUNTER — Ambulatory Visit (INDEPENDENT_AMBULATORY_CARE_PROVIDER_SITE_OTHER): Payer: BC Managed Care – PPO | Admitting: Family Medicine

## 2013-09-26 ENCOUNTER — Encounter: Payer: Self-pay | Admitting: Family Medicine

## 2013-09-26 VITALS — BP 110/84 | HR 69 | Temp 98.5°F | Ht 66.5 in | Wt 187.8 lb

## 2013-09-26 DIAGNOSIS — M25529 Pain in unspecified elbow: Secondary | ICD-10-CM

## 2013-09-26 DIAGNOSIS — M25522 Pain in left elbow: Secondary | ICD-10-CM

## 2013-09-26 NOTE — Progress Notes (Signed)
Pre visit review using our clinic review tool, if applicable. No additional management support is needed unless otherwise documented below in the visit note. 

## 2013-09-26 NOTE — Progress Notes (Signed)
Dr. Frederico Hamman T. Chosen Garron, MD, [facility] [place], [address] Phone: [phone] Fax: [phone]  09/26/2013  Patient: Erik [name], MRN: [id number], DOB: [birth year], 58 y.o.  Primary Physician:  Arnette Norris, MD  Chief Complaint: Elbow Pain  Subjective:   Erik [name] is a 58 y.o. very pleasant male patient who presents with the following:  Left elbow and felt ? Pop laterally. Yesterday. Had a clip-board and felt a lot of pain laterrally. Can feel a little bump. About a month ago in the same spot, and the patient was reaching very rapidly, and felt some pain abruptly in that area. This has gradually been getting better, and he felt a pop in the same area. There is a little bit of some swelling without any bruising there.  Past Medical History, Surgical History, Social History, Family History, Problem List, Medications, and Allergies have been reviewed and updated if relevant.  GEN: No fevers, chills. Nontoxic. Primarily MSK c/o today. MSK: Detailed in the HPI GI: tolerating PO intake without difficulty Neuro: No numbness, parasthesias, or tingling associated. Otherwise the pertinent positives of the ROS are noted above.   Objective:   BP 110/84  Pulse 69  Temp(Src) 98.5 F (36.9 C) (Oral)  Ht 5' 6.5" (1.689 m)  Wt 187 lb 12 oz (85.163 kg)  BMI 29.85 kg/m2   GEN: WDWN, NAD, Non-toxic, Alert & Oriented x 3 HEENT: Atraumatic, Normocephalic.  Ears and Nose: No external deformity. EXTR: No clubbing/cyanosis/edema NEURO: Normal gait.  PSYCH: Normally interactive. Conversant. Not depressed or anxious appearing.  Calm demeanor.   L elbow Ecchymosis or edema: neg ROM: full flexion, extension, pronation, supination Most of the elbow is perfectly nontender.He does have one area that localizes out to a tendon on the lateral elbow, but not in the common extensor tendon that appears to be tender in isolation. Shoulder ROM:  Full Flexion: 5/5 Extension: 4+/5 Supination: 5/5  Pronation: 5/5 Wrist ext: 5/5 Wrist flexion: 5/5 No gross bony abnormality Varus and Valgus stress: stable ECRB tenderness: neg Medial epicondyle: NT Lateral epicondyle, resisted wrist extension from wrist full pronation and flexion: NT grip: 5/5  sensation intact Tinel's, Elbow: negative   Radiology: No results found.  Assessment and Plan:   Left elbow pain  By exam, it feels like he partially tore his extensor carpi ulnaris, but it is located, he has normal function. Expect 4-6 weeks to heal. No ballistic movements at all. Maintain elbow motion.   Follow-up: No Follow-up on file.  New Prescriptions   No medications on file   No orders of the defined types were placed in this encounter.    Signed,  Maud Deed. Dontrelle Mazon, MD   Patient's Medications  New Prescriptions   No medications on file  Previous Medications   AMLODIPINE (NORVASC) 5 MG TABLET    Take 1 tablet (5 mg total) by mouth daily.   SIMVASTATIN (ZOCOR) 20 MG TABLET    Take 1 tablet (20 mg total) by mouth at bedtime.   VALSARTAN (DIOVAN) 80 MG TABLET    TAKE THREE TABLETS BY MOUTH ONCE DAILY  Modified Medications   No medications on file  Discontinued Medications   BUPROPION (WELLBUTRIN XL) 150 MG 24 HR TABLET    Take 1 tablet (150 mg total) by mouth daily.   VARENICLINE (CHANTIX STARTING MONTH PAK) 0.5 MG X 11 & 1 MG X 42 TABLET    Take one 0.5 mg tablet by  mouth once daily for 3 days, then increase to one 0.5 mg tablet twice daily for 4 days, then increase to one 1 mg tablet twice daily.

## 2013-12-02 ENCOUNTER — Other Ambulatory Visit: Payer: Self-pay | Admitting: *Deleted

## 2013-12-02 MED ORDER — VALSARTAN 80 MG PO TABS: ORAL_TABLET | ORAL | Status: DC

## 2014-02-26 ENCOUNTER — Other Ambulatory Visit: Payer: Self-pay | Admitting: Family Medicine

## 2014-07-07 ENCOUNTER — Ambulatory Visit (INDEPENDENT_AMBULATORY_CARE_PROVIDER_SITE_OTHER): Payer: PRIVATE HEALTH INSURANCE | Admitting: Family Medicine

## 2014-07-07 ENCOUNTER — Encounter: Payer: Self-pay | Admitting: Family Medicine

## 2014-07-07 VITALS — BP 150/100 | HR 64 | Temp 98.0°F | Wt 186.0 lb

## 2014-07-07 DIAGNOSIS — L989 Disorder of the skin and subcutaneous tissue, unspecified: Secondary | ICD-10-CM | POA: Diagnosis not present

## 2014-07-07 DIAGNOSIS — R238 Other skin changes: Secondary | ICD-10-CM

## 2014-07-07 NOTE — Patient Instructions (Signed)
Try to move your left foot periodically and that should help.  Use talc if needed for sweating.  If you still have some itching/irritation on the shin then use OTC hydrocortisone cream.  Take care.  Glad to see you.  Check on your BP meds at the pharmacy

## 2014-07-07 NOTE — Progress Notes (Signed)
Pre visit review using our clinic review tool, if applicable. No additional management support is needed unless otherwise documented below in the visit note.  Off BP meds, he'll call pharmacy about refills.    L leg sx.  Itching on the L anterior shin.  L foot would get sweaty.  He noted the changes about 3-4 months ago.  It hasn't gotten worse in the meantime, just continued at the same level.   No R leg/foot sx.   No FCNAVD.  No rash.  Lotion (otc- hand cream) would help a little, for a little while.   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    Meds, vitals, and allergies reviewed.   ROS: See HPI.  Otherwise, noncontributory.  nad Normal sensation to light touch and monofilament on the B feet Normal DP pulses B Very minimal skin irritation on the L anterior shin, normal inspection o/w on the legs and feet rrr

## 2014-07-08 DIAGNOSIS — R238 Other skin changes: Secondary | ICD-10-CM | POA: Insufficient documentation

## 2014-07-08 NOTE — Assessment & Plan Note (Signed)
Can try otc hydrocortisone if needed on the shin, talc as needed on the feet, and encouraged patient to intentionally move his L foot more while driving.  F/u prn.

## 2014-09-05 ENCOUNTER — Other Ambulatory Visit: Payer: Self-pay | Admitting: Family Medicine

## 2014-09-22 ENCOUNTER — Ambulatory Visit (INDEPENDENT_AMBULATORY_CARE_PROVIDER_SITE_OTHER): Payer: PRIVATE HEALTH INSURANCE | Admitting: Family Medicine

## 2014-09-22 ENCOUNTER — Encounter: Payer: Self-pay | Admitting: Family Medicine

## 2014-09-22 VITALS — BP 138/92 | HR 80 | Temp 98.2°F | Ht 66.5 in | Wt 179.0 lb

## 2014-09-22 DIAGNOSIS — I10 Essential (primary) hypertension: Secondary | ICD-10-CM

## 2014-09-22 DIAGNOSIS — E782 Mixed hyperlipidemia: Secondary | ICD-10-CM

## 2014-09-22 DIAGNOSIS — F17200 Nicotine dependence, unspecified, uncomplicated: Secondary | ICD-10-CM

## 2014-09-22 DIAGNOSIS — Z86010 Personal history of colonic polyps: Secondary | ICD-10-CM

## 2014-09-22 DIAGNOSIS — Z0000 Encounter for general adult medical examination without abnormal findings: Secondary | ICD-10-CM | POA: Diagnosis not present

## 2014-09-22 DIAGNOSIS — Z720 Tobacco use: Secondary | ICD-10-CM | POA: Diagnosis not present

## 2014-09-22 LAB — COMPREHENSIVE METABOLIC PANEL
ALT: 11 U/L (ref 0–53)
AST: 12 U/L (ref 0–37)
Albumin: 4.2 g/dL (ref 3.5–5.2)
Alkaline Phosphatase: 81 U/L (ref 39–117)
BILIRUBIN TOTAL: 0.4 mg/dL (ref 0.2–1.2)
BUN: 11 mg/dL (ref 6–23)
CALCIUM: 9.7 mg/dL (ref 8.4–10.5)
CHLORIDE: 104 meq/L (ref 96–112)
CO2: 27 meq/L (ref 19–32)
Creatinine, Ser: 1.18 mg/dL (ref 0.40–1.50)
GFR: 81.22 mL/min (ref >60.00)
GLUCOSE: 93 mg/dL (ref 70–99)
Potassium: 5.1 mEq/L (ref 3.5–5.1)
Sodium: 138 mEq/L (ref 135–145)
Total Protein: 7.5 g/dL (ref 6.0–8.3)

## 2014-09-22 LAB — CBC WITH DIFFERENTIAL/PLATELET
BASOS ABS: 0.1 10*3/uL (ref 0.0–0.1)
BASOS PCT: 1.6 % (ref 0.0–3.0)
EOS ABS: 0.1 10*3/uL (ref 0.0–0.7)
Eosinophils Relative: 1.4 % (ref 0.0–5.0)
HEMATOCRIT: 44.1 % (ref 39.0–52.0)
Hemoglobin: 14.7 g/dL (ref 13.0–17.0)
LYMPHS ABS: 2.2 10*3/uL (ref 0.7–4.0)
Lymphocytes Relative: 43.8 % (ref 12.0–46.0)
MCHC: 33.4 g/dL (ref 30.0–36.0)
MCV: 88.4 fl (ref 78.0–100.0)
MONOS PCT: 8.3 % (ref 3.0–12.0)
Monocytes Absolute: 0.4 10*3/uL (ref 0.1–1.0)
NEUTROS ABS: 2.3 10*3/uL (ref 1.4–7.7)
NEUTROS PCT: 44.9 % (ref 43.0–77.0)
PLATELETS: 158 10*3/uL (ref 150.0–400.0)
RBC: 4.98 Mil/uL (ref 4.22–5.81)
RDW: 14.7 % (ref 11.5–15.5)
WBC: 5 10*3/uL (ref 4.0–10.5)

## 2014-09-22 LAB — LIPID PANEL
CHOL/HDL RATIO: 4
Cholesterol: 220 mg/dL — ABNORMAL HIGH (ref 0–200)
HDL: 62.6 mg/dL (ref >39.00)
LDL CALC: 144 mg/dL — AB (ref 0–99)
NONHDL: 157.71
TRIGLYCERIDES: 70 mg/dL (ref 0.0–149.0)
VLDL: 14 mg/dL (ref 0.0–40.0)

## 2014-09-22 LAB — PSA: PSA: 0.69 ng/mL (ref 0.10–4.00)

## 2014-09-22 MED ORDER — VARENICLINE TARTRATE 0.5 MG X 11 & 1 MG X 42 PO MISC: ORAL | Status: DC

## 2014-09-22 NOTE — Assessment & Plan Note (Signed)
Smoking cessation instruction/counseling given:  counseled patient on the dangers of tobacco use, advised patient to stop smoking, and reviewed strategies to maximize success   Rx given to pt for Chantix.  He picked a quit date- 10/31.

## 2014-09-22 NOTE — Patient Instructions (Signed)
Great to see you. We will call you with your lab results and you can view them online.  I am proud of you for wanting to quit. Pick a quit date, go ahead and start chantix.

## 2014-09-22 NOTE — Assessment & Plan Note (Signed)
Refer to GI for follow-up colonoscopy. 

## 2014-09-22 NOTE — Assessment & Plan Note (Signed)
Reviewed preventive care protocols, scheduled due services, and updated immunizations Discussed nutrition, exercise, diet, and healthy lifestyle.  Orders Placed This Encounter  Procedures  . CBC with Differential/Platelet  . Comprehensive metabolic panel  . Lipid panel  . PSA  . Hepatitis C Antibody  . HIV antibody (with reflex)  . Ambulatory referral to Gastroenterology   Declined influenza vaccine.

## 2014-09-22 NOTE — Progress Notes (Signed)
Pre visit review using our clinic review tool, if applicable. No additional management support is needed unless otherwise documented below in the visit note. 

## 2014-09-22 NOTE — Progress Notes (Signed)
Subjective:   Patient ID: Erik [name], male    DOB: 02/02/1955, 59 y.o.   MRN: [id number]  Erik [name] is a pleasant 59 y.o. year old male who presents to clinic today forAnnual Exam  and follow up of chronic medical conditions on 09/22/2014  HPI   Td 07/11/05  Smoking cessation- still has not quit.  Prescribed Chantix and wellbutrin but never took it. He thinks he is ready to quit now.  Wants to make positive changes.   Flex sig 12/01/11- Dr. Hilarie Fredrickson, advised colonoscopy 1 year - he did not make appointment for this.   HLD- on Zocor 20 mg daily. Denies myalgias.  Due ofr labs  Lab Results  Component Value Date   CHOL 241* 09/02/2013   HDL 57.30 09/02/2013   LDLCALC 151* 09/02/2013   LDLDIRECT 121.3 06/21/2010   TRIG 165.0* 09/02/2013   CHOLHDL 4 09/02/2013   HTN- has been compliant with Diovan and Norvasc daily. Denies HA, blurred vision, CP or SOB.   BP Readings from Last 3 Encounters:  09/22/14 138/92  07/07/14 150/100  09/26/13 110/84   Patient Active Problem List   Diagnosis Date Noted  . Visit for well man health check 09/22/2014  . Hx of adenomatous colonic polyps 11/18/2011  . 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 NICOTINE ADDICTION 05/10/2007  . HYPERLIPIDEMIA 12/18/2006  . Essential hypertension 12/18/2006   Past Medical History  Diagnosis Date  . Hyperlipidemia   . Hypertension   . Seasonal allergies   . Diverticulosis   . Adenomatous colon polyp   . Hemorrhoids    Past Surgical History  Procedure Laterality Date  . Shoulder surgery  [place]    right   Social History  Substance Use Topics  . Smoking status: Current Every Day Smoker -- 0.50 packs/day for .5 years    Types: Cigarettes  . Smokeless tobacco: Never Used  . Alcohol Use: 0.0 oz/week    0 Standard drinks or equivalent per week     Comment: rarely   Family History  Problem Relation Age of Onset  . Hypertension Brother   . Diabetes Paternal Uncle   . Breast cancer  Maternal Grandmother   . Colon cancer Neg Hx   . Esophageal cancer Neg Hx   . Rectal cancer Neg Hx   . Stomach cancer Neg Hx   . Heart disease Father    No Known Allergies Current Outpatient Prescriptions on File Prior to Visit  Medication Sig Dispense Refill  . amLODipine (NORVASC) 5 MG tablet TAKE 1 TABLET (5 MG TOTAL) BY MOUTH DAILY. 30 tablet 0  . simvastatin (ZOCOR) 20 MG tablet TAKE 1 TABLET BY MOUTH AT BEDTIME 30 tablet 0  . valsartan (DIOVAN) 80 MG tablet TAKE THREE TABLETS BY MOUTH ONCE DAILY 90 tablet 2   No current facility-administered medications on file prior to visit.   The PMH, PSH, Social History, Family History, Medications, and allergies have been reviewed in [facility], and have been updated if relevant.   Review of Systems  Constitutional: Negative.   HENT: Negative.   Eyes: Negative.   Respiratory: Negative.   Cardiovascular: Negative.   Gastrointestinal: Negative.   Endocrine: Negative.   Genitourinary: Negative.   Musculoskeletal: Negative.   Skin: Negative.   Allergic/Immunologic: Negative.   Neurological: Negative.   Hematological: Negative.   Psychiatric/Behavioral: Negative.   All other systems reviewed and are negative.      Objective:    BP 138/92 mmHg  Pulse 80  Temp(Src) 98.2 F (36.8 C) (Oral)  Ht 5' 6.5" (1.689 m)  Wt 179 lb (81.194 kg)  BMI 28.46 kg/m2  SpO2 98%   Physical Exam  Constitutional: He is oriented to person, place, and time. He appears well-developed and well-nourished. No distress.  HENT:  Head: Normocephalic.  Eyes: Pupils are equal, round, and reactive to light.  Neck: Normal range of motion.  Cardiovascular: Normal rate, regular rhythm and normal heart sounds.   Pulmonary/Chest: Effort normal and breath sounds normal.  Abdominal: Soft. Bowel sounds are normal. He exhibits no distension. There is no tenderness.  Musculoskeletal: Normal range of motion. He exhibits no edema.  Neurological: He is alert and oriented  to person, place, and time. He displays normal reflexes. No cranial nerve deficit. He exhibits normal muscle tone. Coordination normal.  Skin: Skin is warm and dry. No rash noted. No erythema.  Psychiatric: He has a normal mood and affect. His behavior is normal. Thought content normal.  Nursing note and vitals reviewed.        Assessment & Plan:   HYPERLIPIDEMIA  Essential hypertension  NICOTINE ADDICTION  Hx of adenomatous colonic polyps  Visit for well man health check No Follow-up on file.

## 2014-09-22 NOTE — Assessment & Plan Note (Signed)
Well controlled on current rxs. No changes made. 

## 2014-09-22 NOTE — Assessment & Plan Note (Signed)
Continue current rx. Check labs today. 

## 2014-09-23 ENCOUNTER — Encounter: Payer: Self-pay | Admitting: Internal Medicine

## 2014-09-23 LAB — HEPATITIS C ANTIBODY: HCV Ab: NEGATIVE

## 2014-09-23 LAB — HIV ANTIBODY (ROUTINE TESTING W REFLEX): HIV 1&2 Ab, 4th Generation: NONREACTIVE

## 2014-11-01 ENCOUNTER — Other Ambulatory Visit: Payer: Self-pay | Admitting: Family Medicine

## 2014-11-28 ENCOUNTER — Ambulatory Visit (AMBULATORY_SURGERY_CENTER): Payer: Self-pay | Admitting: *Deleted

## 2014-11-28 VITALS — Ht 68.0 in | Wt 178.0 lb

## 2014-11-28 DIAGNOSIS — Z86010 Personal history of colonic polyps: Secondary | ICD-10-CM

## 2014-11-28 MED ORDER — NA SULFATE-K SULFATE-MG SULF 17.5-3.13-1.6 GM/177ML PO SOLN: 1.0000 | Freq: Once | ORAL | Status: DC

## 2014-11-28 NOTE — Progress Notes (Signed)
No egg or soy allergy. No anesthesia problems.  No home O2.  No diet meds.  

## 2014-12-12 ENCOUNTER — Encounter: Payer: PRIVATE HEALTH INSURANCE | Admitting: Internal Medicine

## 2014-12-16 ENCOUNTER — Encounter: Payer: Self-pay | Admitting: Internal Medicine

## 2014-12-25 ENCOUNTER — Encounter: Payer: PRIVATE HEALTH INSURANCE | Admitting: Internal Medicine

## 2014-12-25 ENCOUNTER — Ambulatory Visit (AMBULATORY_SURGERY_CENTER): Payer: PRIVATE HEALTH INSURANCE | Admitting: Internal Medicine

## 2014-12-25 ENCOUNTER — Encounter: Payer: Self-pay | Admitting: Internal Medicine

## 2014-12-25 VITALS — BP 148/74 | HR 55 | Temp 96.9°F | Resp 14 | Ht 68.0 in | Wt 178.0 lb

## 2014-12-25 DIAGNOSIS — Z86010 Personal history of colonic polyps: Secondary | ICD-10-CM | POA: Diagnosis present

## 2014-12-25 HISTORY — PX: COLONOSCOPY: SHX174

## 2014-12-25 MED ORDER — SODIUM CHLORIDE 0.9 % IV SOLN: 500.0000 mL | INTRAVENOUS | Status: DC

## 2014-12-25 NOTE — Progress Notes (Signed)
To recovery, report to [place], RN, VSS 

## 2014-12-25 NOTE — Patient Instructions (Signed)
YOU HAD AN ENDOSCOPIC PROCEDURE TODAY AT [facility]:   Refer to the procedure report that was given to you for any specific questions about what was found during the examination.  If the procedure report does not answer your questions, please call your gastroenterologist to clarify.  If you requested that your care partner not be given the details of your procedure findings, then the procedure report has been included in a sealed envelope for you to review at your convenience later.  YOU SHOULD EXPECT: Some feelings of bloating in the abdomen. Passage of more gas than usual.  Walking can help get rid of the air that was put into your GI tract during the procedure and reduce the bloating. If you had a lower endoscopy (such as a colonoscopy or flexible sigmoidoscopy) you may notice spotting of blood in your stool or on the toilet paper. If you underwent a bowel prep for your procedure, you may not have a normal bowel movement for a few days.  Please Note:  You might notice some irritation and congestion in your nose or some drainage.  This is from the oxygen used during your procedure.  There is no need for concern and it should clear up in a day or so.  SYMPTOMS TO REPORT IMMEDIATELY:   Following lower endoscopy (colonoscopy or flexible sigmoidoscopy):  Excessive amounts of blood in the stool  Significant tenderness or worsening of abdominal pains  Swelling of the abdomen that is new, acute  Fever of 100F or higher  For urgent or emergent issues, a gastroenterologist can be reached at any hour by calling [phone].   DIET: Your first meal following the procedure should be a small meal and then it is ok to progress to your normal diet. Heavy or fried foods are harder to digest and may make you feel nauseous or bloated.  Likewise, meals heavy in dairy and vegetables can increase bloating.  Drink plenty of fluids but you should avoid alcoholic beverages for 24  hours.  ACTIVITY:  You should plan to take it easy for the rest of today and you should NOT DRIVE or use heavy machinery until tomorrow (because of the sedation medicines used during the test).    FOLLOW UP: Our staff will call the number listed on your records the next business day following your procedure to check on you and address any questions or concerns that you may have regarding the information given to you following your procedure. If we do not reach you, we will leave a message.  However, if you are feeling well and you are not experiencing any problems, there is no need to return our call.  We will assume that you have returned to your regular daily activities without incident.  If any biopsies were taken you will be contacted by phone or by letter within the next 1-3 weeks.  Please call us at [phone] if you have not heard about the biopsies in 3 weeks.   SIGNATURES/CONFIDENTIALITY: You and/or your care partner have signed paperwork which will be entered into your electronic medical record.  These signatures attest to the fact that that the information above on your After Visit Summary has been reviewed and is understood.  Full responsibility of the confidentiality of this discharge information lies with you and/or your care-partner.  Next colonoscopy- 3 years  Please read over handouts about hemorrhoids and high fiber diets  Please continue your normal medications

## 2014-12-25 NOTE — Op Note (Addendum)
[place]  [employer] [place], [address]   COLONOSCOPY PROCEDURE REPORT  PATIENT: Erik [name], Erik [name]  MR#: [id number] BIRTHDATE: 1955/04/29 , 60  yrs. old GENDER: male ENDOSCOPIST: Jerene Bears, MD PROCEDURE DATE:  12/25/2014 PROCEDURE:   Colonoscopy, surveillance First Screening Colonoscopy - Avg.  risk and is 50 yrs.  old or older - No.  Prior Negative Screening - Now for repeat screening. N/A  History of Adenoma - Now for follow-up colonoscopy & has been > or = to 3 yrs.  Yes hx of adenoma.  Has been 3 or more years since last colonoscopy.  Polyps removed today? No Recommend repeat exam, <10 yrs? Yes high risk ASA CLASS:   Class II INDICATIONS:Surveillance due to prior colonic neoplasia, history of tubular adenoma from proximal colon, history of multiple hyperplastic polyps in the descending, sigmoid colon and rectum, last exam 2013 . MEDICATIONS: Monitored anesthesia care and Propofol 250 mg IV  DESCRIPTION OF PROCEDURE:   After the risks benefits and alternatives of the procedure were thoroughly explained, informed consent was obtained.  The digital rectal exam revealed no rectal mass.   The LB [id number]  endoscope was introduced through the anus and advanced to the cecum, which was identified by both the appendix and ileocecal valve. No adverse events experienced. The quality of the prep was good.  (Suprep was used)  The instrument was then slowly withdrawn as the colon was fully examined. Estimated blood loss is zero unless otherwise noted in this procedure report.  COLON FINDINGS: There was moderate diverticulosis noted throughout the entire examined colon.   Multiple small (< 6-7 mm) hyperplastic appearing sessile polyps in the distal sigmoid colon and rectosigmoid.  Due to history of hyperplastic polyps previously extensively sampled, these polyps were not removed today.  No polyps seen above the sigmoid colon to the cecum.  Retroflexed views  revealed large internal hemorrhoids. The time to cecum = 3.6 Withdrawal time = 9.4   The scope was withdrawn and the procedure completed.  COMPLICATIONS: There were no immediate complications.  ENDOSCOPIC IMPRESSION: 1.   Moderate diverticulosis was noted throughout the entire examined colon 2.   Multiple small (< 6-7 mm) hyperplastic appearing sessile polyps in the distal sigmoid colon and rectosigmoid.  Due to history of hyperplastic polyps previously extensively sampled, these polyps were not removed today 3.   Large internal hemorrhoids.  RECOMMENDATIONS: 1.  High fiber diet 2.  Repeat Colonoscopy in 3 years.  eSigned:  Jerene Bears, MD 12/25/2014 9:23 AM Revised: 12/25/2014 9:23 AM  cc: Arnette Norris MD and The Patient   PATIENT NAME:  [name], [name] MR#: [id number]

## 2014-12-26 ENCOUNTER — Telehealth: Payer: Self-pay | Admitting: *Deleted

## 2014-12-26 NOTE — Telephone Encounter (Signed)
  Follow up Call-  Call back number 12/25/2014  Post procedure Call Back phone  # [phone]  Permission to leave phone message Yes     Patient questions:  Message left to call us if necessary.

## 2015-04-07 ENCOUNTER — Encounter: Payer: Self-pay | Admitting: Internal Medicine

## 2015-04-07 ENCOUNTER — Ambulatory Visit (INDEPENDENT_AMBULATORY_CARE_PROVIDER_SITE_OTHER): Payer: Managed Care, Other (non HMO) | Admitting: Internal Medicine

## 2015-04-07 VITALS — BP 136/88 | HR 70 | Temp 97.8°F | Wt 180.0 lb

## 2015-04-07 DIAGNOSIS — M778 Other enthesopathies, not elsewhere classified: Secondary | ICD-10-CM

## 2015-04-07 DIAGNOSIS — M7581 Other shoulder lesions, right shoulder: Secondary | ICD-10-CM | POA: Insufficient documentation

## 2015-04-07 NOTE — Patient Instructions (Signed)
Please ice your painful spot for 15 minutes or so ---several times a day. Take 3 ibuprofen three times daily with meals  If you are not better by Monday, call and I will set you up with an orthopedist.

## 2015-04-07 NOTE — Progress Notes (Signed)
Pre visit review using our clinic review tool, if applicable. No additional management support is needed unless otherwise documented below in the visit note. 

## 2015-04-07 NOTE — Assessment & Plan Note (Signed)
Possible rotator cuff damage but can't tell now Discussed ice NSAIDs Out of work till 4/3 If not improved by next week, will set up with ortho

## 2015-04-07 NOTE — Progress Notes (Signed)
   Subjective:    Patient ID: Erik [name], male    DOB: Jan 23, 1955, 60 y.o.   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 amLODipine (NORVASC) 5 MG tablet TAKE 1 TABLET (5 MG TOTAL) BY MOUTH DAILY. 30 tablet 5  . simvastatin (ZOCOR) 20 MG tablet TAKE 1 TABLET BY MOUTH AT BEDTIME 30 tablet 5  . valsartan (DIOVAN) 80 MG tablet TAKE 3 TABLETS BY MOUTH ONCE DAILY 90 tablet 5   No current facility-administered medications on file prior to visit.    No Known Allergies  Past Medical History  Diagnosis Date  . Hyperlipidemia   . Hypertension   . Seasonal allergies   . Diverticulosis   . Adenomatous colon polyp   . Hemorrhoids   . Arthritis     Past Surgical History  Procedure Laterality Date  . Shoulder surgery  [place]    right  . Colonoscopy      Family History  Problem Relation Age of Onset  . Hypertension Brother   . Diabetes Paternal Uncle   . Breast cancer Maternal Grandmother   . Colon cancer Neg Hx   . Esophageal cancer Neg Hx   . Rectal cancer Neg Hx   . Stomach cancer Neg Hx   . Heart disease Father     Social History   Social History  . Marital Status: Married    Spouse Name: N/A  . Number of Children: 2  . Years of Education: N/A   Occupational History  . Truck drive    Social History Main Topics  . Smoking status: Current Every Day Smoker -- 0.50 packs/day for .5 years    Types: Cigarettes  . Smokeless tobacco: Never Used  .  Alcohol Use: 0.0 oz/week    0 Standard drinks or equivalent per week     Comment: rarely  . Drug Use: No  . Sexual Activity: Not on file   Other Topics Concern  . Not on file   Social History Narrative   Review of Systems  No fever No other significant joint problems     Objective:   Physical Exam  Musculoskeletal:  Right shoulder not swollen No bursa or specific joint tenderness Very severe point tenderness over deltoid tendon area Active abduction to about 60 degrees--- then limited by pain Very little passive external rotation till pain Internal rotation only mildly reduced          Assessment & Plan:

## 2015-12-08 ENCOUNTER — Encounter: Payer: Self-pay | Admitting: Family Medicine

## 2015-12-08 ENCOUNTER — Ambulatory Visit (INDEPENDENT_AMBULATORY_CARE_PROVIDER_SITE_OTHER): Payer: Managed Care, Other (non HMO) | Admitting: Family Medicine

## 2015-12-08 VITALS — BP 138/86 | HR 63 | Temp 97.5°F | Ht 67.0 in | Wt 171.2 lb

## 2015-12-08 DIAGNOSIS — E782 Mixed hyperlipidemia: Secondary | ICD-10-CM

## 2015-12-08 DIAGNOSIS — Z0000 Encounter for general adult medical examination without abnormal findings: Secondary | ICD-10-CM | POA: Diagnosis not present

## 2015-12-08 DIAGNOSIS — I10 Essential (primary) hypertension: Secondary | ICD-10-CM | POA: Diagnosis not present

## 2015-12-08 LAB — LIPID PANEL
CHOL/HDL RATIO: 4
Cholesterol: 257 mg/dL — ABNORMAL HIGH (ref 0–200)
HDL: 66.5 mg/dL (ref >39.00)
LDL CALC: 169 mg/dL — AB (ref 0–99)
NONHDL: 190.49
Triglycerides: 106 mg/dL (ref 0.0–149.0)
VLDL: 21.2 mg/dL (ref 0.0–40.0)

## 2015-12-08 LAB — COMPREHENSIVE METABOLIC PANEL
ALT: 13 U/L (ref 0–53)
AST: 14 U/L (ref 0–37)
Albumin: 4 g/dL (ref 3.5–5.2)
Alkaline Phosphatase: 70 U/L (ref 39–117)
BILIRUBIN TOTAL: 0.5 mg/dL (ref 0.2–1.2)
BUN: 11 mg/dL (ref 6–23)
CHLORIDE: 105 meq/L (ref 96–112)
CO2: 28 meq/L (ref 19–32)
CREATININE: 1.2 mg/dL (ref 0.40–1.50)
Calcium: 9.5 mg/dL (ref 8.4–10.5)
GFR: 79.33 mL/min (ref >60.00)
GLUCOSE: 84 mg/dL (ref 70–99)
Potassium: 4.7 mEq/L (ref 3.5–5.1)
Sodium: 138 mEq/L (ref 135–145)
Total Protein: 7 g/dL (ref 6.0–8.3)

## 2015-12-08 LAB — PSA: PSA: 0.86 ng/mL (ref 0.10–4.00)

## 2015-12-08 MED ORDER — AMLODIPINE BESYLATE 5 MG PO TABS: ORAL_TABLET | ORAL | 3 refills | Status: DC

## 2015-12-08 MED ORDER — VALSARTAN 80 MG PO TABS: 240.0000 mg | ORAL_TABLET | Freq: Every day | ORAL | 3 refills | Status: DC

## 2015-12-08 MED ORDER — SIMVASTATIN 20 MG PO TABS: 20.0000 mg | ORAL_TABLET | Freq: Every day | ORAL | 3 refills | Status: DC

## 2015-12-08 NOTE — Assessment & Plan Note (Signed)
Reviewed preventive care protocols, scheduled due services, and updated immunizations Discussed nutrition, exercise, diet, and healthy lifestyle.  Orders Placed This Encounter  Procedures  . Comprehensive metabolic panel  . Lipid panel  . PSA    

## 2015-12-08 NOTE — Assessment & Plan Note (Signed)
Well controlled. No changes made to rxs. 

## 2015-12-08 NOTE — Patient Instructions (Signed)
Great to see you. I am really sorry for your loss.

## 2015-12-08 NOTE — Progress Notes (Signed)
Subjective:   Patient ID: Erik [name], male    DOB: Mar 24, 1955, 60 y.o.   MRN: [id number]  Erik [name] is a pleasant 60 y.o. year old male who presents to clinic today forAnnual Exam  and follow up of chronic medical conditions on 12/08/2015  HPI  Unfortunately his mother in law passed away on Thanksgiving day. Feels his family is coping ok.  Td 07/11/05  Smoking cessation- still has not quit.  Prescribed Chantix and wellbutrin but never took it. Colonosocpy 12/25/14   HLD- on Zocor 20 mg daily. Denies myalgias.  Due for labs.  Lab Results  Component Value Date   CHOL 220 (H) 09/22/2014   HDL 62.60 09/22/2014   LDLCALC 144 (H) 09/22/2014   LDLDIRECT 121.3 06/21/2010   TRIG 70.0 09/22/2014   CHOLHDL 4 09/22/2014   HTN- has been compliant with Diovan and Norvasc daily. Denies HA, blurred vision, CP or SOB.   BP Readings from Last 3 Encounters:  12/08/15 138/86  04/07/15 136/88  12/25/14 (!) 148/74   Lab Results  Component Value Date   PSA 0.69 09/22/2014   PSA 0.50 07/27/2011   PSA 0.57 05/13/2008    Patient Active Problem List   Diagnosis Date Noted  . Visit for well man health check 09/22/2014  . Hx of adenomatous colonic polyps 11/18/2011  . 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 NICOTINE ADDICTION 05/10/2007  . HYPERLIPIDEMIA 12/18/2006  . Essential hypertension 12/18/2006   Past Medical History:  Diagnosis Date  . Adenomatous colon polyp   . Arthritis   . Diverticulosis   . Hemorrhoids   . Hyperlipidemia   . Hypertension   . Seasonal allergies    Past Surgical History:  Procedure Laterality Date  . COLONOSCOPY    . [place]   right   Social History  Substance Use Topics  . Smoking status: Current Every Day Smoker    Packs/day: 0.50    Years: 0.50    Types: Cigarettes  . Smokeless tobacco: Never Used  . Alcohol use 0.0 oz/week     Comment: rarely   Family History  Problem Relation Age of Onset  . Hypertension  Brother   . Diabetes Paternal Uncle   . Breast cancer Maternal Grandmother   . Colon cancer Neg Hx   . Esophageal cancer Neg Hx   . Rectal cancer Neg Hx   . Stomach cancer Neg Hx   . Heart disease Father    No Known Allergies No current outpatient prescriptions on file prior to visit.   No current facility-administered medications on file prior to visit.    The PMH, PSH, Social History, Family History, Medications, and allergies have been reviewed in [facility], and have been updated if relevant.   Review of Systems  Constitutional: Negative.   HENT: Negative.   Eyes: Negative.   Respiratory: Negative.   Cardiovascular: Negative.   Gastrointestinal: Negative.   Endocrine: Negative.   Genitourinary: Negative.   Musculoskeletal: Negative.   Skin: Negative.   Allergic/Immunologic: Negative.   Neurological: Negative.   Hematological: Negative.   Psychiatric/Behavioral: Negative.   All other systems reviewed and are negative.      Objective:    BP 138/86   Pulse 63   Temp 97.5 F (36.4 C) (Oral)   Ht 5\' 7"  (1.702 m)   Wt 171 lb 4 oz (77.7 kg)   SpO2 98%   BMI 26.82 kg/m    Physical Exam  Constitutional: He is  oriented to person, place, and time. He appears well-developed and well-nourished. No distress.  HENT:  Head: Normocephalic.  Eyes: Pupils are equal, round, and reactive to light.  Neck: Normal range of motion.  Cardiovascular: Normal rate, regular rhythm and normal heart sounds.   Pulmonary/Chest: Effort normal and breath sounds normal.  Abdominal: Soft. Bowel sounds are normal. He exhibits no distension. There is no tenderness.  Musculoskeletal: Normal range of motion. He exhibits no edema.  Neurological: He is alert and oriented to person, place, and time. He displays normal reflexes. No cranial nerve deficit. He exhibits normal muscle tone. Coordination normal.  Skin: Skin is warm and dry. No rash noted. No erythema.  Psychiatric: He has a normal mood and  affect. His behavior is normal. Thought content normal.  Nursing note and vitals reviewed.        Assessment & Plan:   Visit for well man health check - Plan: Comprehensive metabolic panel, Lipid panel, PSA  HYPERLIPIDEMIA  Essential hypertension No Follow-up on file.

## 2015-12-08 NOTE — Progress Notes (Signed)
Pre visit review using our clinic review tool, if applicable. No additional management support is needed unless otherwise documented below in the visit note. 

## 2015-12-08 NOTE — Assessment & Plan Note (Signed)
Continue current dose of statin. Check labs today. 

## 2016-12-30 ENCOUNTER — Other Ambulatory Visit: Payer: Self-pay | Admitting: Primary Care

## 2016-12-30 ENCOUNTER — Encounter: Payer: Self-pay | Admitting: Primary Care

## 2016-12-30 ENCOUNTER — Ambulatory Visit: Payer: Managed Care, Other (non HMO) | Admitting: Primary Care

## 2016-12-30 DIAGNOSIS — E782 Mixed hyperlipidemia: Secondary | ICD-10-CM | POA: Diagnosis not present

## 2016-12-30 DIAGNOSIS — I10 Essential (primary) hypertension: Secondary | ICD-10-CM | POA: Diagnosis not present

## 2016-12-30 DIAGNOSIS — M549 Dorsalgia, unspecified: Secondary | ICD-10-CM

## 2016-12-30 DIAGNOSIS — M545 Low back pain: Secondary | ICD-10-CM

## 2016-12-30 DIAGNOSIS — G8929 Other chronic pain: Secondary | ICD-10-CM | POA: Diagnosis not present

## 2016-12-30 LAB — COMPREHENSIVE METABOLIC PANEL
ALT: 11 U/L (ref 0–53)
AST: 14 U/L (ref 0–37)
Albumin: 4.1 g/dL (ref 3.5–5.2)
Alkaline Phosphatase: 74 U/L (ref 39–117)
BILIRUBIN TOTAL: 0.5 mg/dL (ref 0.2–1.2)
BUN: 13 mg/dL (ref 6–23)
CALCIUM: 9.3 mg/dL (ref 8.4–10.5)
CHLORIDE: 106 meq/L (ref 96–112)
CO2: 26 meq/L (ref 19–32)
Creatinine, Ser: 1.05 mg/dL (ref 0.40–1.50)
GFR: 92.22 mL/min (ref >60.00)
GLUCOSE: 82 mg/dL (ref 70–99)
POTASSIUM: 4.5 meq/L (ref 3.5–5.1)
Sodium: 137 mEq/L (ref 135–145)
Total Protein: 7.7 g/dL (ref 6.0–8.3)

## 2016-12-30 LAB — LIPID PANEL
CHOL/HDL RATIO: 3
Cholesterol: 173 mg/dL (ref 0–200)
HDL: 68.2 mg/dL (ref >39.00)
LDL CALC: 94 mg/dL (ref 0–99)
NonHDL: 104.81
Triglycerides: 53 mg/dL (ref 0.0–149.0)
VLDL: 10.6 mg/dL (ref 0.0–40.0)

## 2016-12-30 MED ORDER — DICLOFENAC SODIUM 75 MG PO TBEC: 75.0000 mg | DELAYED_RELEASE_TABLET | Freq: Two times a day (BID) | ORAL | 0 refills | Status: DC | PRN

## 2016-12-30 MED ORDER — SIMVASTATIN 20 MG PO TABS: 20.0000 mg | ORAL_TABLET | Freq: Every day | ORAL | 3 refills | Status: DC

## 2016-12-30 MED ORDER — AMLODIPINE BESYLATE 5 MG PO TABS: ORAL_TABLET | ORAL | 3 refills | Status: DC

## 2016-12-30 MED ORDER — VALSARTAN 80 MG PO TABS: 240.0000 mg | ORAL_TABLET | Freq: Every day | ORAL | 3 refills | Status: DC

## 2016-12-30 NOTE — Assessment & Plan Note (Signed)
BP above goal initially, improved on recheck to 134/80. Continue current medications, refills sent to pharmacy. Will have him check with his pharmacy to ensure his valsartan is not on the recall list. BMP pending.

## 2016-12-30 NOTE — Progress Notes (Signed)
Subjective:    Patient ID: Erik [name], male    DOB: 1955/08/29, 61 y.o.   MRN: [id number]  HPI  Erik [name] is a 61 year old male who presents today to transfer care from Dr. Deborra Medina.  1) Essential Hypertension: Currently managed on amlodipine 5 mg and valsartan 80 mg. He was never notified by his pharmacy that his Valsartan was on the recall list. He's been compliant to his BP medication. He does not have a BP cuff at home but will check at the store sporadically and will get readings of 130's/90's. He denies chest pain, dizziness, headaches.  BP Readings from Last 3 Encounters:  12/30/16 (!) 150/96  12/08/15 138/86  04/07/15 136/88     2) Hyperlipidemia: Currently managed on simvastatin 20 mg. Lipid panel one year ago with TC of 257, LDL of 169. He had run out of his medications 10 days before his labs were drawn last year. He has been compliant to his simvastatin and has not run out this time. He denies myalgias.   3) Back Pain: Located to the bilateral lower back that has been present for years. Over the past six months he's noticed increased pain. Pain is worse after rising from a sitting position. He describes his pain as tightness, stiffness, and occasionally an intermittent sharp pain. He will take Ibuprofen for pain without improvement. 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 He does occasionally notice pain to the left posterior thigh infrequently. He denies numbness and weakness, injury/trauma, loss of bowel/bladder control.   Review of Systems  Eyes: Negative for visual disturbance.  Respiratory: Negative for shortness of breath.   Cardiovascular: Negative for chest pain.  Musculoskeletal: Positive for back pain.  Neurological: Negative for dizziness and headaches.       Past Medical History:  Diagnosis Date  . Adenomatous colon polyp   . Arthritis   . Diverticulosis   . Hemorrhoids   . Hyperlipidemia   . Hypertension   .  Seasonal allergies      Social History   Socioeconomic History  . Marital status: Married    Spouse name: Not on file  . Number of children: 2  . Years of education: Not on file  . Highest education level: Not on file  Social Needs  . Financial resource strain: Not on file  . Food insecurity - worry: Not on file  . Food insecurity - inability: Not on file  . Transportation needs - medical: Not on file  . Transportation needs - non-medical: Not on file  Occupational History  . 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 Smoking status: Current Every Day Smoker    Packs/day: 0.50    Years: 0.50    Pack years: 0.25    Types: Cigarettes  . Smokeless tobacco: Never Used  Substance and Sexual Activity  . Alcohol use: Yes    Alcohol/week: 0.0 oz    Comment: rarely  . Drug use: No  . Sexual activity: Not on file  Other Topics Concern  . Not on file  Social History Narrative  . Not on file    Past Surgical History:  Procedure Laterality Date  . COLONOSCOPY    . [place]   right    Family History  Problem Relation Age of Onset  . Hypertension Brother   . Diabetes Paternal Uncle   .  Breast cancer Maternal Grandmother   . Colon cancer Neg Hx   . Esophageal cancer Neg Hx   . Rectal cancer Neg Hx   . Stomach cancer Neg Hx   . Heart disease Father     No Known Allergies  Current Outpatient Medications on File Prior to Visit  Medication Sig Dispense Refill  . amLODipine (NORVASC) 5 MG tablet TAKE 1 TABLET (5 MG TOTAL) BY MOUTH DAILY. 90 tablet 3  . simvastatin (ZOCOR) 20 MG tablet Take 1 tablet (20 mg total) by mouth at bedtime. 90 tablet 3  . valsartan (DIOVAN) 80 MG tablet Take 3 tablets (240 mg total) by mouth daily. 270 tablet 3   No current facility-administered medications on file prior to visit.     BP (!) 150/96   Pulse 72   Temp 98.1 F (36.7 C) (Oral)   Wt 165 lb 8 oz (75.1 kg)   SpO2 98%   BMI  25.92 kg/m    Objective:   Physical Exam  Constitutional: He is oriented to person, place, and time. He appears well-nourished.  Neck: Neck supple.  Cardiovascular: Normal rate and regular rhythm.  Pulmonary/Chest: Effort normal and breath sounds normal. He has no wheezes. He has no rales.  Musculoskeletal:       Lumbar back: He exhibits pain. He exhibits normal range of motion and no tenderness.       Back:  Negative straight leg raise bilaterally  Neurological: He is alert and oriented to person, place, and time.  Skin: Skin is warm and dry.  Psychiatric: He has a normal mood and affect.          Assessment & Plan:

## 2016-12-30 NOTE — Patient Instructions (Addendum)
Stop by the lab prior to leaving today. I will notify you of your results once received.   Please ask your pharmacist if your Valsartan is on the recall list.  I will send in the simvastatin once I get your lab results back.  Come back anytime at your convenience to have the xray done.  You may take diclofenac tablets up to twice daily as needed for back pain. Please update me if no improvement after stretching exercises and medication.  It was a pleasure meeting you!   Back Exercises The following exercises strengthen the muscles that help to support the back. They also help to keep the lower back flexible. Doing these exercises can help to prevent back pain or lessen existing pain. If you have back pain or discomfort, try doing these exercises 2-3 times each day or as told by your health care provider. When the pain goes away, do them once each day, but increase the number of times that you repeat the steps for each exercise (do more repetitions). If you do not have back pain or discomfort, do these exercises once each day or as told by your health care provider. Exercises Single Knee to Chest  Repeat these steps 3-5 times for each leg: 1. Lie on your back on a firm bed or the floor with your legs extended. 2. Bring one knee to your chest. Your other leg should stay extended and in contact with the floor. 3. Hold your knee in place by grabbing your knee or thigh. 4. Pull on your knee until you feel a gentle stretch in your lower back. 5. Hold the stretch for 10-30 seconds. 6. Slowly release and straighten your leg.  Pelvic Tilt  Repeat these steps 5-10 times: 1. Lie on your back on a firm bed or the floor with your legs extended. 2. Bend your knees so they are pointing toward the ceiling and your feet are flat on the floor. 3. Tighten your lower abdominal muscles to press your lower back against the floor. This motion will tilt your pelvis so your tailbone points up toward the  ceiling instead of pointing to your feet or the floor. 4. With gentle tension and even breathing, hold this position for 5-10 seconds.  Cat-Cow  Repeat these steps until your lower back becomes more flexible: 1. Get into a hands-and-knees position on a firm surface. Keep your hands under your shoulders, and keep your knees under your hips. You may place padding under your knees for comfort. 2. Let your head hang down, and point your tailbone toward the floor so your lower back becomes rounded like the back of a cat. 3. Hold this position for 5 seconds. 4. Slowly lift your head and point your tailbone up toward the ceiling so your back forms a sagging arch like the back of a cow. 5. Hold this position for 5 seconds.  Press-Ups  Repeat these steps 5-10 times: 1. Lie on your abdomen (face-down) on the floor. 2. Place your palms near your head, about shoulder-width apart. 3. While you keep your back as relaxed as possible and keep your hips on the floor, slowly straighten your arms to raise the top half of your body and lift your shoulders. Do not use your back muscles to raise your upper torso. You may adjust the placement of your hands to make yourself more comfortable. 4. Hold this position for 5 seconds while you keep your back relaxed. 5. Slowly return to lying flat on  the floor.  Bridges  Repeat these steps 10 times: 1. Lie on your back on a firm surface. 2. Bend your knees so they are pointing toward the ceiling and your feet are flat on the floor. 3. Tighten your buttocks muscles and lift your buttocks off of the floor until your waist is at almost the same height as your knees. You should feel the muscles working in your buttocks and the back of your thighs. If you do not feel these muscles, slide your feet 1-2 inches farther away from your buttocks. 4. Hold this position for 3-5 seconds. 5. Slowly lower your hips to the starting position, and allow your buttocks muscles to relax  completely.  If this exercise is too easy, try doing it with your arms crossed over your chest. Abdominal Crunches  Repeat these steps 5-10 times: 1. Lie on your back on a firm bed or the floor with your legs extended. 2. Bend your knees so they are pointing toward the ceiling and your feet are flat on the floor. 3. Cross your arms over your chest. 4. Tip your chin slightly toward your chest without bending your neck. 5. Tighten your abdominal muscles and slowly raise your trunk (torso) high enough to lift your shoulder blades a tiny bit off of the floor. Avoid raising your torso higher than that, because it can put too much stress on your low back and it does not help to strengthen your abdominal muscles. 6. Slowly return to your starting position.  Back Lifts Repeat these steps 5-10 times: 1. Lie on your abdomen (face-down) with your arms at your sides, and rest your forehead on the floor. 2. Tighten the muscles in your legs and your buttocks. 3. Slowly lift your chest off of the floor while you keep your hips pressed to the floor. Keep the back of your head in line with the curve in your back. Your eyes should be looking at the floor. 4. Hold this position for 3-5 seconds. 5. Slowly return to your starting position.  Contact a health care provider if:  Your back pain or discomfort gets much worse when you do an exercise.  Your back pain or discomfort does not lessen within 2 hours after you exercise. If you have any of these problems, stop doing these exercises right away. Do not do them again unless your health care provider says that you can. Get help right away if:  You develop sudden, severe back pain. If this happens, stop doing the exercises right away. Do not do them again unless your health care provider says that you can. This information is not intended to replace advice given to you by your health care provider. Make sure you discuss any questions you have with your health  care provider. Document Released: 02/04/2004 Document Revised: 05/06/2015 Document Reviewed: 02/20/2014 Elsevier Interactive Patient Education  2017 [employer].

## 2016-12-30 NOTE — Assessment & Plan Note (Signed)
Present for years, worse over last 6 months. Suspect arthritis, but will check plain films given increased symptoms. Exam overall unremarkable. Will trial Rx of diclofenac to use PRN. Discussed stretching exercises, increased mobility. Consider PT if no improvement in conservative measures.

## 2016-12-30 NOTE — Assessment & Plan Note (Signed)
Due for repeat lipids today. Will send refills once labs return.

## 2017-01-02 ENCOUNTER — Encounter: Payer: Self-pay | Admitting: *Deleted

## 2017-01-12 ENCOUNTER — Ambulatory Visit (INDEPENDENT_AMBULATORY_CARE_PROVIDER_SITE_OTHER)
Admission: RE | Admit: 2017-01-12 | Discharge: 2017-01-12 | Disposition: A | Discharge status: Home / Self Care | Payer: Managed Care, Other (non HMO) | Source: Ambulatory Visit | Attending: Primary Care | Admitting: Primary Care

## 2017-01-12 DIAGNOSIS — M545 Low back pain: Secondary | ICD-10-CM

## 2017-01-12 DIAGNOSIS — G8929 Other chronic pain: Secondary | ICD-10-CM | POA: Diagnosis not present

## 2017-01-16 ENCOUNTER — Telehealth: Payer: Self-pay | Admitting: Primary Care

## 2017-01-16 ENCOUNTER — Other Ambulatory Visit: Payer: Self-pay | Admitting: Primary Care

## 2017-01-16 DIAGNOSIS — M545 Low back pain, unspecified: Secondary | ICD-10-CM

## 2017-01-16 DIAGNOSIS — G8929 Other chronic pain: Secondary | ICD-10-CM

## 2017-01-16 DIAGNOSIS — S22000A Wedge compression fracture of unspecified thoracic vertebra, initial encounter for closed fracture: Secondary | ICD-10-CM

## 2017-01-16 NOTE — Telephone Encounter (Signed)
Please notify patient that I also recommend a bone density scan given recent evidence of his xray. Please notify me if he's agreeable and we'll get this set up. Needs to start on Calcium 1200 mg with Vitamin D 800 units everyday in the meantime.

## 2017-01-17 NOTE — Telephone Encounter (Signed)
Spoke to pt and advised of advice and instruction. Pt agrees to bone density and start of new meds

## 2017-01-17 NOTE — Telephone Encounter (Signed)
Noted.  Bone density scan ordered and referral placed physical therapy.

## 2017-02-04 ENCOUNTER — Other Ambulatory Visit: Payer: Self-pay | Admitting: Primary Care

## 2017-02-04 DIAGNOSIS — M545 Low back pain: Principal | ICD-10-CM

## 2017-02-04 DIAGNOSIS — G8929 Other chronic pain: Secondary | ICD-10-CM

## 2017-02-07 ENCOUNTER — Other Ambulatory Visit: Payer: Self-pay | Admitting: Primary Care

## 2017-02-07 DIAGNOSIS — G8929 Other chronic pain: Secondary | ICD-10-CM

## 2017-02-07 DIAGNOSIS — M545 Low back pain, unspecified: Secondary | ICD-10-CM

## 2017-02-08 NOTE — Telephone Encounter (Signed)
Ok to refill? Electronically refill request for diclofenac (VOLTAREN) 75 MG EC tablet  Last prescribed on 01/17/2017. Last seen on 12/30/2016

## 2017-02-09 NOTE — Telephone Encounter (Signed)
Does he actually need a refill of the diclofenac or was this just an auto refill?

## 2017-02-09 NOTE — Telephone Encounter (Signed)
Message left for patient to return my call.  

## 2017-02-10 NOTE — Telephone Encounter (Signed)
Noted, refill sent to pharmacy. 

## 2017-02-10 NOTE — Telephone Encounter (Signed)
Message left for patient to return my call.  

## 2017-02-10 NOTE — Telephone Encounter (Signed)
Pt states this med was for him to try, and yes, he would like a refill. Pt states this med is working pretty well!!  [id number] [address], [place] [phone] (Phone) [phone] (Fax)

## 2017-02-21 ENCOUNTER — Ambulatory Visit
Admission: RE | Admit: 2017-02-21 | Discharge: 2017-02-21 | Disposition: A | Discharge status: Home / Self Care | Payer: Managed Care, Other (non HMO) | Source: Ambulatory Visit | Attending: Primary Care | Admitting: Primary Care

## 2017-02-21 DIAGNOSIS — X58XXXA Exposure to other specified factors, initial encounter: Secondary | ICD-10-CM | POA: Insufficient documentation

## 2017-02-21 DIAGNOSIS — S22000A Wedge compression fracture of unspecified thoracic vertebra, initial encounter for closed fracture: Secondary | ICD-10-CM

## 2017-02-21 DIAGNOSIS — M85852 Other specified disorders of bone density and structure, left thigh: Secondary | ICD-10-CM | POA: Insufficient documentation

## 2017-02-24 ENCOUNTER — Ambulatory Visit: Payer: Managed Care, Other (non HMO) | Admitting: Primary Care

## 2017-02-24 VITALS — BP 134/78 | HR 84 | Temp 97.9°F | Wt 171.5 lb

## 2017-02-24 DIAGNOSIS — G8929 Other chronic pain: Secondary | ICD-10-CM

## 2017-02-24 DIAGNOSIS — R1904 Left lower quadrant abdominal swelling, mass and lump: Secondary | ICD-10-CM

## 2017-02-24 DIAGNOSIS — M8580 Other specified disorders of bone density and structure, unspecified site: Secondary | ICD-10-CM | POA: Diagnosis not present

## 2017-02-24 DIAGNOSIS — M5442 Lumbago with sciatica, left side: Secondary | ICD-10-CM

## 2017-02-24 DIAGNOSIS — S22000D Wedge compression fracture of unspecified thoracic vertebra, subsequent encounter for fracture with routine healing: Secondary | ICD-10-CM | POA: Diagnosis not present

## 2017-02-24 DIAGNOSIS — S22000A Wedge compression fracture of unspecified thoracic vertebra, initial encounter for closed fracture: Secondary | ICD-10-CM | POA: Insufficient documentation

## 2017-02-24 MED ORDER — METHOCARBAMOL 500 MG PO TABS: 500.0000 mg | ORAL_TABLET | Freq: Three times a day (TID) | ORAL | 0 refills | Status: DC | PRN

## 2017-02-24 MED ORDER — ALENDRONATE SODIUM 70 MG PO TABS: ORAL_TABLET | ORAL | 1 refills | Status: DC

## 2017-02-24 NOTE — Progress Notes (Signed)
Subjective:    Patient ID: Erik [name], male    DOB: 1955/04/28, 62 y.o.   MRN: [id number]  HPI  Erik [name] is a 62 year old male who presents today with a chief complaint of back pain. He also reports abdominal mass.  1) Chronic Back Pain: Mostly located to the left lower back that is chronic. He also endorses intermittent pain with numbness/tingling 1-2 days weekly. He has to lift, turn, and pick up objects at work all day through his work day. He's been taking diclofenac 75 mg BID that was prescribed during his last visit with some improvement, but continues to experience a dull and tightness.   He has completed 5-6 sessions of physical therapy with improvement for a few days, then pain returns. He denies changes in bowel/bladder control, weakness, injury. He was diagnosed with multiple mild lower thoracic vertebral body compressions in early January 2019. Bone density scan with osteopenia. He does have some occasional tightness to the mid thoracic spine.   2) Abdominal Swelling: Located to left lower/lateral abdomen for which he noticed several weeks ago. He denies pain, pressure, skin color changes, diarrhea, constipation, bloody stools. His last colonoscopy was in 2016. He does rest heavy objects against his left lower abdomen frequently.  Review of Systems  Gastrointestinal: Negative for abdominal pain, blood in stool, diarrhea, nausea and vomiting.       Abdominal mass  Genitourinary:       Denies loss of bladder/bowel control  Musculoskeletal: Positive for arthralgias and back pain.  Neurological: Positive for numbness. Negative for weakness.       Past Medical History:  Diagnosis Date  . Adenomatous colon polyp   . Arthritis   . Diverticulosis   . Hemorrhoids   . Hyperlipidemia   . Hypertension   . Seasonal allergies      Social History   Socioeconomic History  . Marital status: Married    Spouse name: Not on file  . Number of children: 2  . Years of education: Not  on file  . Highest education level: Not on file  Social Needs  . Financial resource strain: Not on file  . Food insecurity - worry: Not on file  . Food insecurity - inability: Not on file  . Transportation needs - medical: Not on file  . Transportation needs - non-medical: Not on file  Occupational History  . 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 Smoking status: Current Every Day Smoker    Packs/day: 0.50    Years: 0.50    Pack years: 0.25    Types: Cigarettes  . Smokeless tobacco: Never Used  Substance and Sexual Activity  . Alcohol use: Yes    Alcohol/week: 0.0 oz    Comment: rarely  . Drug use: No  . Sexual activity: Not on file  Other Topics Concern  . Not on file  Social History Narrative  . Not on file    Past Surgical History:  Procedure Laterality Date  . COLONOSCOPY    . [place]   right    Family History  Problem Relation Age of Onset  . Hypertension Brother   . Diabetes Paternal Uncle   . Breast cancer Maternal Grandmother   . Colon cancer Neg Hx   . Esophageal cancer Neg Hx   . Rectal cancer Neg Hx   . Stomach cancer Neg Hx   . Heart disease Father  No Known Allergies  Current Outpatient Medications on File Prior to Visit  Medication Sig Dispense Refill  . amLODipine (NORVASC) 5 MG tablet TAKE 1 TABLET (5 MG TOTAL) BY MOUTH DAILY. 90 tablet 3  . diclofenac (VOLTAREN) 75 MG EC tablet TAKE 1 TABLET (75 MG TOTAL) BY MOUTH 2 (TWO) TIMES DAILY AS NEEDED FOR MODERATE PAIN. 60 tablet 0  . simvastatin (ZOCOR) 20 MG tablet Take 1 tablet (20 mg total) by mouth at bedtime. 90 tablet 3  . valsartan (DIOVAN) 80 MG tablet Take 3 tablets (240 mg total) by mouth daily. 270 tablet 3   No current facility-administered medications on file prior to visit.     BP 134/78   Pulse 84   Temp 97.9 F (36.6 C) (Oral)   Wt 171 lb 8 oz (77.8 kg)   SpO2 97%   BMI 26.86 kg/m    Objective:   Physical Exam    Constitutional: He appears well-nourished.  Neck: Neck supple.  Cardiovascular: Normal rate.  Pulmonary/Chest: Effort normal.  Abdominal: Soft. Bowel sounds are normal. There is no tenderness.    Soft mass noted to left lower/lateral abdomen while standing, no visualization or palpation with lying. Non tender. No firm masses.   Musculoskeletal:       Thoracic back: He exhibits spasm. He exhibits normal range of motion, no bony tenderness and no pain.       Lumbar back: He exhibits pain. He exhibits normal range of motion and no tenderness.       Back:  Skin: Skin is warm and dry.          Assessment & Plan:  Abdominal Mass:  Located to left lower/latera abdomen. Only appreciated with patient standing, not sitting.  Could be hernia given history of repetitive heavy lifting, could be cyst, lipoma. Check abdominal ultrasound for further evaluation.  Pleas Koch, NP

## 2017-02-24 NOTE — Assessment & Plan Note (Signed)
Age indeterminate from plain films in January 2019. Bone density scan with osteopenia. Rx for Fosamax sent to pharmacy for treatment of osteopenia.

## 2017-02-24 NOTE — Patient Instructions (Addendum)
You will be contacted regarding your MRI and ultrasound.  Please let us know if you have not been contacted within one week.   Start alendronate 70 mg tablets for weak bones. Take 1 tablet by mouth once weekly with water on an empty stomach. Do not eat or lay down for 30 minutes.  You may take the methocarbamol tablets every 8 hours as needed for muscle spasms.   I'll be in touch once I receive your MRI and ultrasound results.   It was a pleasure to see you today!

## 2017-02-24 NOTE — Assessment & Plan Note (Signed)
Little improvement with conservative treatment such as oral medications and physical therapy. Lumbar plain films with thoracic compression fractures, otherwise unremarkable.   Given little to no improvement with conservative treatment, will obtain MRI. Rx for methocarbamol sent to pharmacy to use HS for muscle spasms. Continue diclofenac. Consider sending to neurosurgery for further evaluation.

## 2017-02-24 NOTE — Assessment & Plan Note (Addendum)
History of compression fractures to mid lower thoracic spine. Bone density scan with osteopenia, repeat in 2 years. Rx for Fosamax sent to pharmacy for treatment of osteopenia.

## 2017-03-06 ENCOUNTER — Ambulatory Visit
Admission: RE | Admit: 2017-03-06 | Discharge: 2017-03-06 | Disposition: A | Discharge status: Home / Self Care | Payer: Managed Care, Other (non HMO) | Source: Ambulatory Visit | Attending: Primary Care | Admitting: Primary Care

## 2017-03-06 DIAGNOSIS — M5126 Other intervertebral disc displacement, lumbar region: Secondary | ICD-10-CM | POA: Diagnosis not present

## 2017-03-06 DIAGNOSIS — M5442 Lumbago with sciatica, left side: Secondary | ICD-10-CM | POA: Insufficient documentation

## 2017-03-06 DIAGNOSIS — G8929 Other chronic pain: Secondary | ICD-10-CM | POA: Diagnosis present

## 2017-03-06 DIAGNOSIS — R1904 Left lower quadrant abdominal swelling, mass and lump: Secondary | ICD-10-CM | POA: Insufficient documentation

## 2017-03-07 ENCOUNTER — Other Ambulatory Visit: Payer: Self-pay | Admitting: Primary Care

## 2017-03-07 DIAGNOSIS — G8929 Other chronic pain: Secondary | ICD-10-CM

## 2017-03-07 DIAGNOSIS — M5442 Lumbago with sciatica, left side: Principal | ICD-10-CM

## 2017-03-09 ENCOUNTER — Other Ambulatory Visit: Payer: Self-pay | Admitting: Primary Care

## 2017-03-09 DIAGNOSIS — M545 Low back pain, unspecified: Secondary | ICD-10-CM

## 2017-03-09 DIAGNOSIS — G8929 Other chronic pain: Secondary | ICD-10-CM

## 2017-03-10 NOTE — Telephone Encounter (Signed)
Ok to refill? Electronically refill request for diclofenac (VOLTAREN) 75 MG EC tablet  Last prescribed on 02/10/2017. Last seen on 02/24/2017

## 2017-03-10 NOTE — Telephone Encounter (Signed)
Refill sent to pharmacy.   

## 2017-04-23 ENCOUNTER — Other Ambulatory Visit: Payer: Self-pay | Admitting: Primary Care

## 2017-04-23 DIAGNOSIS — M545 Low back pain: Principal | ICD-10-CM

## 2017-04-23 DIAGNOSIS — G8929 Other chronic pain: Secondary | ICD-10-CM

## 2017-04-24 NOTE — Telephone Encounter (Signed)
Electronic refill request Last refill 03/10/17 #60 Last office visit 02/24/17

## 2017-04-26 NOTE — Telephone Encounter (Signed)
Noted, refill sent to pharmacy. 

## 2017-06-07 ENCOUNTER — Ambulatory Visit: Payer: Managed Care, Other (non HMO) | Admitting: Internal Medicine

## 2017-06-15 ENCOUNTER — Other Ambulatory Visit: Payer: Self-pay | Admitting: Primary Care

## 2017-06-15 DIAGNOSIS — G8929 Other chronic pain: Secondary | ICD-10-CM

## 2017-06-15 DIAGNOSIS — M5442 Lumbago with sciatica, left side: Principal | ICD-10-CM

## 2017-06-16 NOTE — Telephone Encounter (Signed)
Refill sent to pharmacy.   

## 2017-06-16 NOTE — Telephone Encounter (Signed)
Last OV and Rx 02/24/2017

## 2017-07-10 ENCOUNTER — Other Ambulatory Visit: Payer: Self-pay | Admitting: Primary Care

## 2017-07-10 DIAGNOSIS — M5442 Lumbago with sciatica, left side: Principal | ICD-10-CM

## 2017-07-10 DIAGNOSIS — G8929 Other chronic pain: Secondary | ICD-10-CM

## 2017-08-15 ENCOUNTER — Other Ambulatory Visit: Payer: Self-pay | Admitting: Primary Care

## 2017-08-15 DIAGNOSIS — S22000D Wedge compression fracture of unspecified thoracic vertebra, subsequent encounter for fracture with routine healing: Secondary | ICD-10-CM

## 2017-08-15 DIAGNOSIS — M8580 Other specified disorders of bone density and structure, unspecified site: Secondary | ICD-10-CM

## 2017-08-15 NOTE — Telephone Encounter (Signed)
Last prescribed on 02/24/2017  Last office visit on 02/24/2017

## 2017-08-16 NOTE — Telephone Encounter (Signed)
Noted, refill sent to pharmacy. 

## 2018-02-27 ENCOUNTER — Encounter: Payer: Self-pay | Admitting: Internal Medicine

## 2018-04-05 ENCOUNTER — Telehealth: Payer: Self-pay

## 2018-04-05 NOTE — Telephone Encounter (Signed)
Pt woke up on 04/04/18 and pt felt like muscle at rt ear was tight. Pt said he is OK now but on & off pt feels like rt jaw wants to go out of joint if opens mouth too wide. Pt can eat and drink. No fever, cough, SOB and no traveling and no known exposure to covid or flu. Pt has not had any injury or animal bite. Pt wanted to get labs and refills done at visit on 04/06/18 and I advised I am not sure if can do that at the acute visit on 04/06/18 at 10:20  Or not and if not pt can schedule CPX. Pt voiced understanding but said he was coming fasting just in case. Pt established care 12/30/16 and acute visit on 02/24/17. FYI to Gentry Fitz NP.

## 2018-04-06 ENCOUNTER — Encounter: Payer: Self-pay | Admitting: Primary Care

## 2018-04-06 ENCOUNTER — Other Ambulatory Visit: Payer: Self-pay

## 2018-04-06 ENCOUNTER — Ambulatory Visit: Payer: BC Managed Care – PPO | Admitting: Primary Care

## 2018-04-06 VITALS — BP 146/98 | HR 74 | Temp 98.1°F | Wt 172.5 lb

## 2018-04-06 DIAGNOSIS — Z125 Encounter for screening for malignant neoplasm of prostate: Secondary | ICD-10-CM

## 2018-04-06 DIAGNOSIS — E782 Mixed hyperlipidemia: Secondary | ICD-10-CM

## 2018-04-06 DIAGNOSIS — S22000D Wedge compression fracture of unspecified thoracic vertebra, subsequent encounter for fracture with routine healing: Secondary | ICD-10-CM

## 2018-04-06 DIAGNOSIS — I10 Essential (primary) hypertension: Secondary | ICD-10-CM | POA: Diagnosis not present

## 2018-04-06 DIAGNOSIS — M5442 Lumbago with sciatica, left side: Secondary | ICD-10-CM | POA: Diagnosis not present

## 2018-04-06 DIAGNOSIS — G8929 Other chronic pain: Secondary | ICD-10-CM

## 2018-04-06 DIAGNOSIS — Z0001 Encounter for general adult medical examination with abnormal findings: Secondary | ICD-10-CM | POA: Diagnosis not present

## 2018-04-06 DIAGNOSIS — Z860101 Personal history of adenomatous and serrated colon polyps: Secondary | ICD-10-CM

## 2018-04-06 DIAGNOSIS — M26621 Arthralgia of right temporomandibular joint: Secondary | ICD-10-CM

## 2018-04-06 DIAGNOSIS — M8580 Other specified disorders of bone density and structure, unspecified site: Secondary | ICD-10-CM

## 2018-04-06 DIAGNOSIS — M545 Low back pain, unspecified: Secondary | ICD-10-CM

## 2018-04-06 DIAGNOSIS — M26629 Arthralgia of temporomandibular joint, unspecified side: Secondary | ICD-10-CM | POA: Insufficient documentation

## 2018-04-06 DIAGNOSIS — Z86010 Personal history of colonic polyps: Secondary | ICD-10-CM

## 2018-04-06 LAB — COMPREHENSIVE METABOLIC PANEL
ALBUMIN: 4.4 g/dL (ref 3.5–5.2)
ALK PHOS: 84 U/L (ref 39–117)
ALT: 11 U/L (ref 0–53)
AST: 13 U/L (ref 0–37)
BILIRUBIN TOTAL: 0.5 mg/dL (ref 0.2–1.2)
BUN: 13 mg/dL (ref 6–23)
CO2: 27 mEq/L (ref 19–32)
Calcium: 9.7 mg/dL (ref 8.4–10.5)
Chloride: 103 mEq/L (ref 96–112)
Creatinine, Ser: 1.24 mg/dL (ref 0.40–1.50)
GFR: 71.32 mL/min (ref >60.00)
GLUCOSE: 85 mg/dL (ref 70–99)
Potassium: 4.7 mEq/L (ref 3.5–5.1)
SODIUM: 139 meq/L (ref 135–145)
TOTAL PROTEIN: 7.9 g/dL (ref 6.0–8.3)

## 2018-04-06 LAB — HEMOGLOBIN A1C: Hgb A1c MFr Bld: 5.6 % (ref 4.6–6.5)

## 2018-04-06 LAB — LIPID PANEL
CHOLESTEROL: 310 mg/dL — AB (ref 0–200)
HDL: 56.2 mg/dL (ref >39.00)
LDL Cholesterol: 231 mg/dL — ABNORMAL HIGH (ref 0–99)
NONHDL: 253.49
Total CHOL/HDL Ratio: 6
Triglycerides: 114 mg/dL (ref 0.0–149.0)
VLDL: 22.8 mg/dL (ref 0.0–40.0)

## 2018-04-06 LAB — PSA: PSA: 0.56 ng/mL (ref 0.10–4.00)

## 2018-04-06 MED ORDER — DICLOFENAC SODIUM 75 MG PO TBEC: 75.0000 mg | DELAYED_RELEASE_TABLET | Freq: Two times a day (BID) | ORAL | 0 refills | Status: DC | PRN

## 2018-04-06 MED ORDER — SIMVASTATIN 20 MG PO TABS: 20.0000 mg | ORAL_TABLET | Freq: Every day | ORAL | 3 refills | Status: DC

## 2018-04-06 MED ORDER — ALENDRONATE SODIUM 70 MG PO TABS: ORAL_TABLET | ORAL | 3 refills | Status: AC

## 2018-04-06 MED ORDER — METHOCARBAMOL 500 MG PO TABS: 500.0000 mg | ORAL_TABLET | Freq: Three times a day (TID) | ORAL | 0 refills | Status: DC | PRN

## 2018-04-06 MED ORDER — VALSARTAN 80 MG PO TABS: 240.0000 mg | ORAL_TABLET | Freq: Every day | ORAL | 3 refills | Status: AC

## 2018-04-06 MED ORDER — AMLODIPINE BESYLATE 5 MG PO TABS: ORAL_TABLET | ORAL | 3 refills | Status: DC

## 2018-04-06 NOTE — Patient Instructions (Signed)
Stop by the lab prior to leaving today. I will notify you of your results once received.   Resume your medications, I sent refills to your pharmacy.  Start exercising. You should be getting 150 minutes of moderate intensity exercise weekly.  It's important to improve your diet by reducing consumption of fast food, fried food, processed snack foods, sugary drinks. Increase consumption of fresh vegetables and fruits, whole grains, water.  Ensure you are drinking 64 ounces of water daily.  Use the diclofenac pain medication as needed for back pain. Use the methocarbamol muscle relaxer as needed for spasms.  Start diclofenac 75 mg tablets 1-2 times daily for jaw pain. Start methocarbamol muscle relaxer 1-2 times daily as needed for jaw pain.  Monitor your blood pressure and notify me if you see readings at or above 135/90.  You will be contacted regarding your referral to GI for the colonoscopy.  Please let us know if you have not been contacted within one week.   It was a pleasure to see you today!   Preventive Care 40-64 Years, Male Preventive care refers to lifestyle choices and visits with your health care provider that can promote health and wellness. What does preventive care include?   A yearly physical exam. This is also called an annual well check.  Dental exams once or twice a year.  Routine eye exams. Ask your health care provider how often you should have your eyes checked.  Personal lifestyle choices, including: ? Daily care of your teeth and gums. ? Regular physical activity. ? Eating a healthy diet. ? Avoiding tobacco and drug use. ? Limiting alcohol use. ? Practicing safe sex. ? Taking low-dose aspirin every day starting at age 37. What happens during an annual well check? The services and screenings done by your health care provider during your annual well check will depend on your age, overall health, lifestyle risk factors, and family history of disease.  Counseling Your health care provider may ask you questions about your:  Alcohol use.  Tobacco use.  Drug use.  Emotional well-being.  Home and relationship well-being.  Sexual activity.  Eating habits.  Work and work Statistician. Screening You may have the following tests or measurements:  Height, weight, and BMI.  Blood pressure.  Lipid and cholesterol levels. These may be checked every 5 years, or more frequently if you are over 45 years old.  Skin check.  Lung cancer screening. You may have this screening every year starting at age 88 if you have a 30-pack-year history of smoking and currently smoke or have quit within the past 15 years.  Colorectal cancer screening. All adults should have this screening starting at age 57 and continuing until age 10. Your health care provider may recommend screening at age 36. You will have tests every 1-10 years, depending on your results and the type of screening test. People at increased risk should start screening at an earlier age. Screening tests may include: ? Guaiac-based fecal occult blood testing. ? Fecal immunochemical test (FIT). ? Stool DNA test. ? Virtual colonoscopy. ? Sigmoidoscopy. During this test, a flexible tube with a tiny camera (sigmoidoscope) is used to examine your rectum and lower colon. The sigmoidoscope is inserted through your anus into your rectum and lower colon. ? Colonoscopy. During this test, a long, thin, flexible tube with a tiny camera (colonoscope) is used to examine your entire colon and rectum.  Prostate cancer screening. Recommendations will vary depending on your family history and other  risks.  Hepatitis C blood test.  Hepatitis B blood test.  Sexually transmitted disease (STD) testing.  Diabetes screening. This is done by checking your blood sugar (glucose) after you have not eaten for a while (fasting). You may have this done every 1-3 years. Discuss your test results, treatment  options, and if necessary, the need for more tests with your health care provider. Vaccines Your health care provider may recommend certain vaccines, such as:  Influenza vaccine. This is recommended every year.  Tetanus, diphtheria, and acellular pertussis (Tdap, Td) vaccine. You may need a Td booster every 10 years.  Varicella vaccine. You may need this if you have not been vaccinated.  Zoster vaccine. You may need this after age 76.  Measles, mumps, and rubella (MMR) vaccine. You may need at least one dose of MMR if you were born in 1957 or later. You may also need a second dose.  Pneumococcal 13-valent conjugate (PCV13) vaccine. You may need this if you have certain conditions and have not been vaccinated.  Pneumococcal polysaccharide (PPSV23) vaccine. You may need one or two doses if you smoke cigarettes or if you have certain conditions.  Meningococcal vaccine. You may need this if you have certain conditions.  Hepatitis A vaccine. You may need this if you have certain conditions or if you travel or work in places where you may be exposed to hepatitis A.  Hepatitis B vaccine. You may need this if you have certain conditions or if you travel or work in places where you may be exposed to hepatitis B.  Haemophilus influenzae type b (Hib) vaccine. You may need this if you have certain risk factors. Talk to your health care provider about which screenings and vaccines you need and how often you need them. This information is not intended to replace advice given to you by your health care provider. Make sure you discuss any questions you have with your health care provider. Document Released: 01/23/2015 Document Revised: 02/16/2017 Document Reviewed: 10/28/2014 Elsevier Interactive Patient Education  2019 [employer].

## 2018-04-06 NOTE — Telephone Encounter (Signed)
Noted, will evaluate. 

## 2018-04-06 NOTE — Assessment & Plan Note (Signed)
Out of simvastatin for several months, refills sent to pharmacy. Lipid panel pending today.

## 2018-04-06 NOTE — Assessment & Plan Note (Signed)
Tetanus UTD, declines influenza and shingles vaccination.  PSA pending. Colonoscopy due, referral placed to GI. Recommended to work on diet, start exercising. Exam as noted. Labs pending. Follow up in 1 year for CPE.

## 2018-04-06 NOTE — Assessment & Plan Note (Signed)
Will resume alendronate 70 mg weekly. Discussed to take once weekly in the morning on a empty stomach with water. No food or other medications for 30 minutes, avoid laying flat for 1 hour.

## 2018-04-06 NOTE — Progress Notes (Signed)
Subjective:    Patient ID: Erik [name], male    DOB: May 25, 1955, 63 y.o.   MRN: [id number]  HPI  Mr. Erik [name] is a 63 year old male who presents today for complete physical and a chief complaint of jaw pain. He is also needing general follow up and medication refills.   Immunizations: -Tetanus: Completed in 2015 -Influenza: Declines  -Shingles: Never complted  Diet: He endorses a fair diet  Breakfast: Cereal, fruit, toast, bacon/eggs Lunch: Skips Dinner: Meat, vegetables, starch Snacks: None Desserts: Ice cream, cookies Beverages: Water, sweet tea, soda, lemonade   Exercise: He is not exercising  Eye exam: Completed in 2018 Dental exam: Overdue Colonoscopy: Completed in 2016 PSA: 0.86 Hep C Screen: Negative  1) Essential Hypertension: Currently managed on amlodipine 5 mg and valsartan 80 mg. He ran out of his medications several weeks ago. When taking BP medications his BP was running 120's/80's. He denies chest pain, dizziness, shortness of breath.   2) Chronic Back Pain: History of thoracic vertebral compression fracture. History of osteopenia and managed on alendronate 70 mg weekly. He's been out of his diclofenac and methocarbamol a while ago. Would take diclofenac once daily on average, using methocarbamol as needed. Also needing refills of alendronate, has been out for a few months.   3) Hyperlipidemia: Currently managed on simvastatin 20 mg for which he ran out of several months ago.   BP Readings from Last 3 Encounters:  04/06/18 (!) 146/98  02/24/17 134/78  12/30/16 134/80     Review of Systems  Constitutional: Negative for unexpected weight change.  HENT: Negative for rhinorrhea.   Respiratory: Negative for cough and shortness of breath.   Cardiovascular: Negative for chest pain.  Gastrointestinal: Negative for constipation and diarrhea.  Genitourinary: Negative for difficulty urinating.  Musculoskeletal: Positive for arthralgias, back pain and myalgias.    Right sided jaw pain with decrease in ROM, stiffness. Difficulty opening mouth wide due to pain. Has not taken anything for symptoms. His wife endorses that he grinds his teeth at night. Chronic back pain with muscle spasms   Skin: Negative for rash.  Allergic/Immunologic: Negative for environmental allergies.  Neurological: Negative for dizziness, numbness and headaches.  Psychiatric/Behavioral: The patient is not nervous/anxious.        Past Medical History:  Diagnosis Date  . Adenomatous colon polyp   . Arthritis   . Diverticulosis   . Hemorrhoids   . Hyperlipidemia   . Hypertension   . Seasonal allergies      Social History   Socioeconomic History  . Marital status: Married    Spouse name: Not on file  . Number of children: 2  . Years of education: Not on file  . Highest education level: Not on file  Occupational History  . Occupation: Truck Geophysical data processor: [employer]  Social Needs  . Financial resource strain: Not on file  . Food insecurity:    Worry: Not on file    Inability: Not on file  . Transportation needs:    Medical: Not on file    Non-medical: Not on file  Tobacco Use  . Smoking status: Current Every Day Smoker    Packs/day: 0.50    Years: 0.50    Pack years: 0.25    Types: Cigarettes  . Smokeless tobacco: Never Used  Substance and Sexual Activity  . Alcohol use: Yes    Alcohol/week: 0.0 standard drinks    Comment: rarely  . Drug use: No  .  Sexual activity: Not on file  Lifestyle  . Physical activity:    Days per week: Not on file    Minutes per session: Not on file  . Stress: Not on file  Relationships  . Social connections:    Talks on phone: Not on file    Gets together: Not on file    Attends religious service: Not on file    Active member of club or organization: Not on file    Attends meetings of clubs or organizations: Not on file    Relationship status: Not on file  . Intimate partner violence:    Fear of current or  ex partner: Not on file    Emotionally abused: Not on file    Physically abused: Not on file    Forced sexual activity: Not on file  Other Topics Concern  . Not on file  Social History Narrative  . Not on file    Past Surgical History:  Procedure Laterality Date  . COLONOSCOPY    . [place]   right    Family History  Problem Relation Age of Onset  . Hypertension Brother   . Heart disease Father   . Diabetes Paternal Uncle   . Breast cancer Maternal Grandmother   . Colon cancer Neg Hx   . Esophageal cancer Neg Hx   . Rectal cancer Neg Hx   . Stomach cancer Neg Hx     No Known Allergies  Current Outpatient Medications on File Prior to Visit  Medication Sig Dispense Refill  . alendronate (FOSAMAX) 70 MG tablet TAKE 1 TABLET ONCE WEEKLY WITH FULL GLASS OF WATER ON EMPTY STOMACH. DON'T LAY DOWN/EAT FOR 30 MINS 12 tablet 1  . amLODipine (NORVASC) 5 MG tablet TAKE 1 TABLET (5 MG TOTAL) BY MOUTH DAILY. 90 tablet 3  . diclofenac (VOLTAREN) 75 MG EC tablet TAKE 1 TABLET (75 MG TOTAL) BY MOUTH 2 (TWO) TIMES DAILY AS NEEDED FOR MODERATE PAIN. 180 tablet 0  . methocarbamol (ROBAXIN) 500 MG tablet TAKE 1 TABLET (500 MG TOTAL) BY MOUTH EVERY 8 (EIGHT) HOURS AS NEEDED FOR MUSCLE SPASMS. 30 tablet 0  . simvastatin (ZOCOR) 20 MG tablet Take 1 tablet (20 mg total) by mouth at bedtime. 90 tablet 3  . valsartan (DIOVAN) 80 MG tablet Take 3 tablets (240 mg total) by mouth daily. 270 tablet 3   No current facility-administered medications on file prior to visit.     BP (!) 146/98   Pulse 74   Temp 98.1 F (36.7 C) (Oral)   Wt 172 lb 8 oz (78.2 kg)   SpO2 97%   BMI 27.02 kg/m    Objective:   Physical Exam  Constitutional: He is oriented to person, place, and time. He appears well-nourished.  HENT:  Head:    Mouth/Throat: Oropharynx is clear and moist. No oropharyngeal exudate.  Decrease in ROM with jaw. No tenderness to TMJ, pain to TMJ with mouth movement.   Eyes: Pupils are equal, round, and reactive to light. EOM are normal.  Neck: Neck supple. No thyromegaly present.  Cardiovascular: Normal rate and regular rhythm.  Respiratory: Effort normal and breath sounds normal.  GI: Soft. Bowel sounds are normal. There is no abdominal tenderness.  Musculoskeletal: Normal range of motion.  Neurological: He is alert and oriented to person, place, and time.  Skin: Skin is warm and dry.  Psychiatric: He has a normal mood and affect.  Assessment & Plan:

## 2018-04-06 NOTE — Assessment & Plan Note (Signed)
Has been out of alendronate for several weeks, refills sent to pharmacy. Discussed directions for use, he verbalized understanding.

## 2018-04-06 NOTE — Assessment & Plan Note (Signed)
Out of medications for months, increased pain with spasms. History of thoracic compression fracture. Refills sent to pharmacy.

## 2018-04-06 NOTE — Assessment & Plan Note (Signed)
Present for the last several days. No obvious dental cause for symptoms. Discussed to purchase a night guard for grinding teeth at night. Will refill diclofenac and methocarbamol which will help. Discussed to eat soft foods, work on jaw stretching.

## 2018-04-06 NOTE — Assessment & Plan Note (Signed)
Overdue for repeat colonoscopy, referral placed to GI.

## 2018-04-06 NOTE — Assessment & Plan Note (Signed)
Out of medications for several weeks, BP above goal today. Refills provided. Will have him monitor BP and report readings at or above 135/90. BMP pending.

## 2018-04-09 ENCOUNTER — Other Ambulatory Visit: Payer: Self-pay | Admitting: Primary Care

## 2018-04-09 DIAGNOSIS — E782 Mixed hyperlipidemia: Secondary | ICD-10-CM

## 2018-05-11 ENCOUNTER — Other Ambulatory Visit: Payer: Self-pay

## 2018-05-11 ENCOUNTER — Other Ambulatory Visit (INDEPENDENT_AMBULATORY_CARE_PROVIDER_SITE_OTHER): Payer: BC Managed Care – PPO

## 2018-05-11 DIAGNOSIS — E782 Mixed hyperlipidemia: Secondary | ICD-10-CM | POA: Diagnosis not present

## 2018-05-11 LAB — LIPID PANEL
Cholesterol: 194 mg/dL (ref 0–200)
HDL: 53.6 mg/dL (ref >39.00)
LDL Cholesterol: 125 mg/dL — ABNORMAL HIGH (ref 0–99)
NonHDL: 140.8
Total CHOL/HDL Ratio: 4
Triglycerides: 80 mg/dL (ref 0.0–149.0)
VLDL: 16 mg/dL (ref 0.0–40.0)

## 2018-05-16 ENCOUNTER — Other Ambulatory Visit: Payer: Self-pay | Admitting: Primary Care

## 2018-05-16 DIAGNOSIS — E782 Mixed hyperlipidemia: Secondary | ICD-10-CM

## 2018-05-16 MED ORDER — ATORVASTATIN CALCIUM 20 MG PO TABS: 20.0000 mg | ORAL_TABLET | Freq: Every day | ORAL | 3 refills | Status: DC

## 2018-05-22 ENCOUNTER — Other Ambulatory Visit: Payer: Self-pay | Admitting: Primary Care

## 2018-05-22 DIAGNOSIS — G8929 Other chronic pain: Secondary | ICD-10-CM

## 2018-05-22 DIAGNOSIS — M5442 Lumbago with sciatica, left side: Secondary | ICD-10-CM

## 2018-06-26 ENCOUNTER — Encounter: Payer: Self-pay | Admitting: Internal Medicine

## 2018-06-28 ENCOUNTER — Other Ambulatory Visit: Payer: Self-pay | Admitting: Primary Care

## 2018-06-28 DIAGNOSIS — G8929 Other chronic pain: Secondary | ICD-10-CM

## 2018-07-08 IMAGING — MR MR LUMBAR SPINE W/O CM
4 of 5 series · 14 of 48 positions shown · non-contrast
Comparison: None.

CLINICAL DATA: Low back pain radiating down the posterior aspect of
the left thigh with numbness and tingling. Symptoms are chronic but
have recently worsened.

EXAM:
MRI LUMBAR SPINE WITHOUT CONTRAST
TECHNIQUE: Multiplanar, multisequence MR imaging of the lumbar spine was
performed. No intravenous contrast was administered.

[Series 2: T2 · sagittal · 4.0mm · 0.44mm/px · 5 of 15 slices shown (1 of 2)]
[im 1/15]
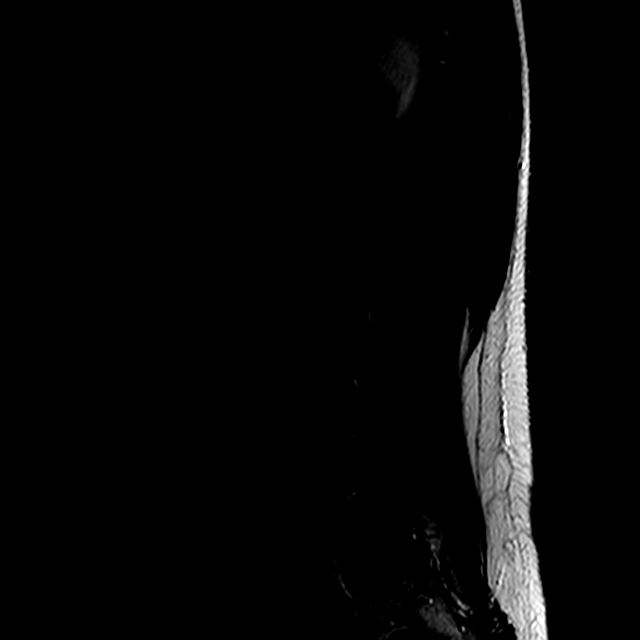
[im 3/15]
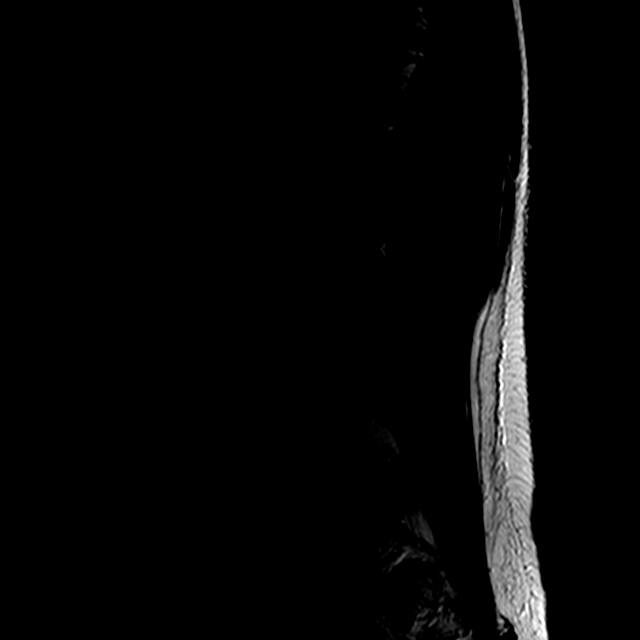
[im 6/15]
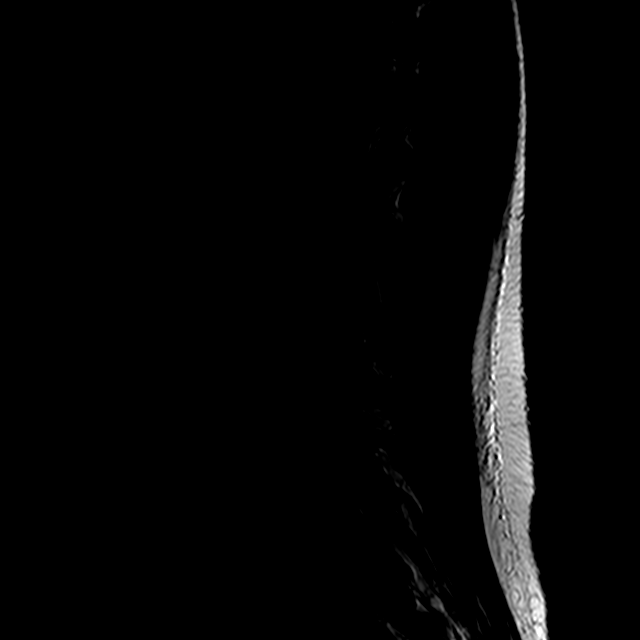
[im 9/15]
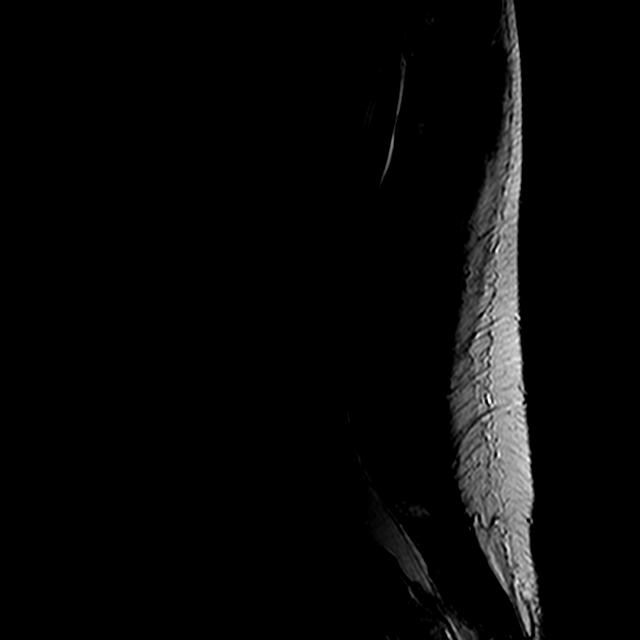
[im 15/15]
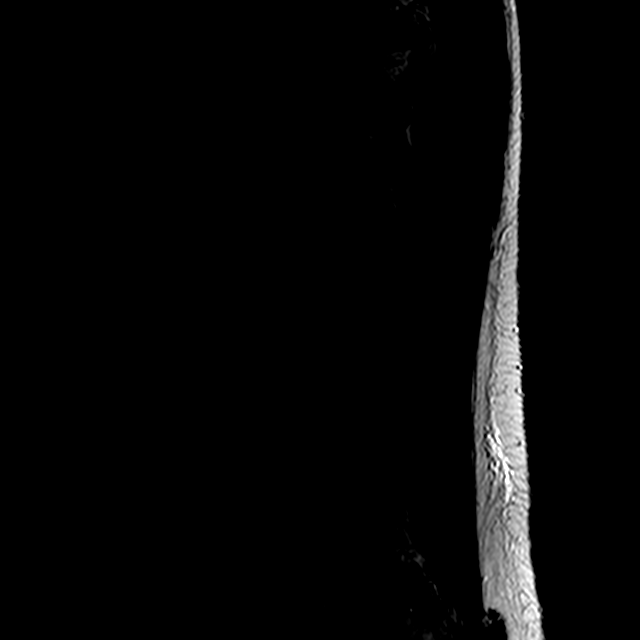

[Series 3: T1 · sagittal · 4.0mm · 0.44mm/px · 3 of 15 slices shown (1 of 2)]
[im 3/15]
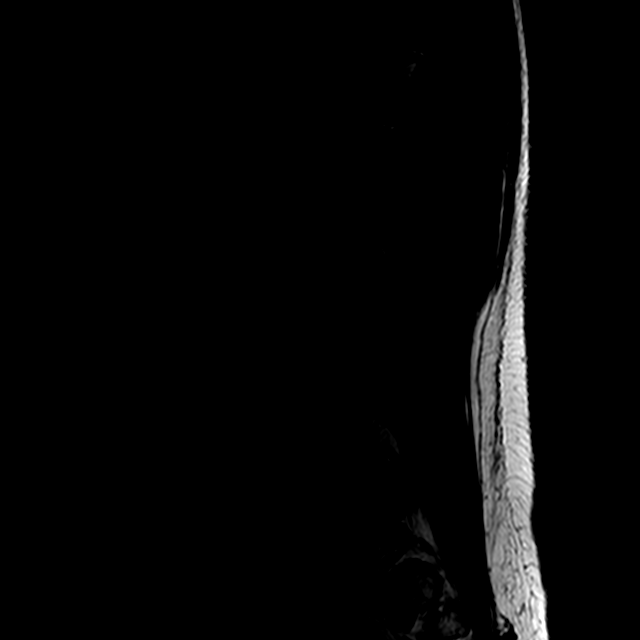
[im 9/15]
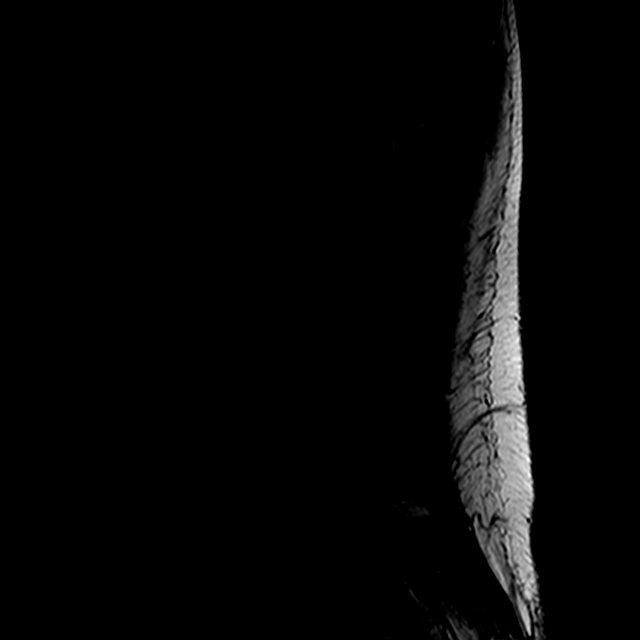
[im 15/15]
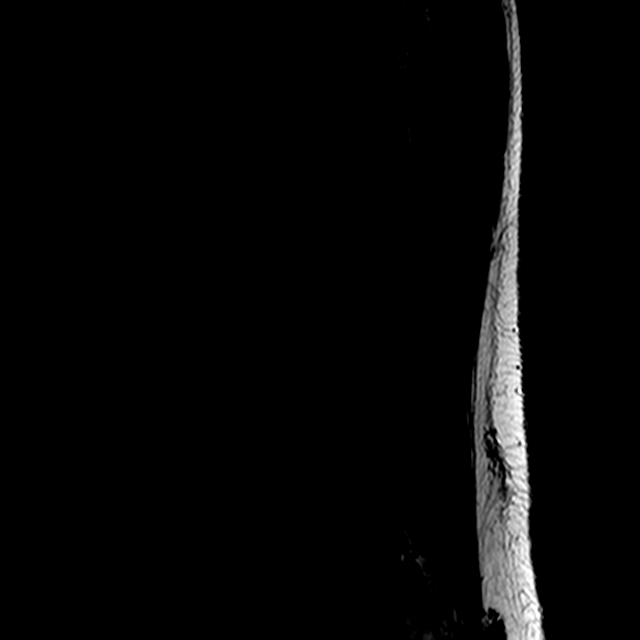

[Series 5: T2 · axial · 4.0mm · 0.39mm/px · z∈[-50,+126]mm · 3 of 37 slices shown (2 of 2)]
[im 6/37]
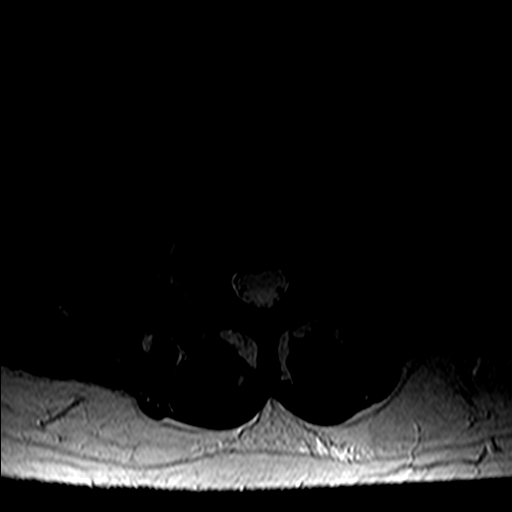
[im 19/37]
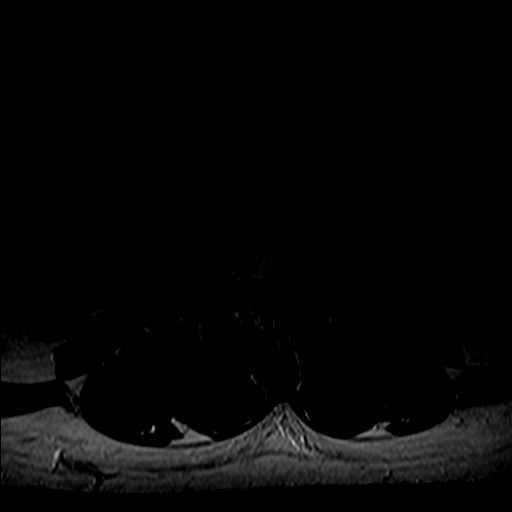
[im 31/37]
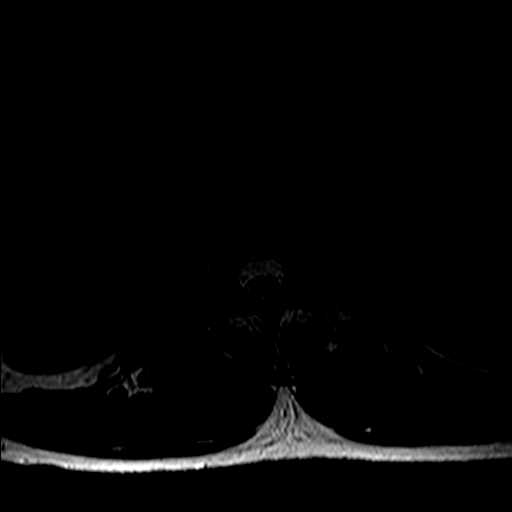

[Series 6: T1 · axial · 4.0mm · 0.39mm/px · z∈[-50,+126]mm · 3 of 37 slices shown (2 of 2)]
[im 6/37]
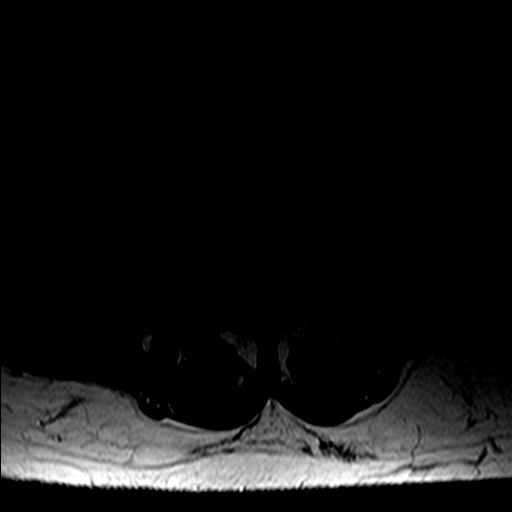
[im 19/37]
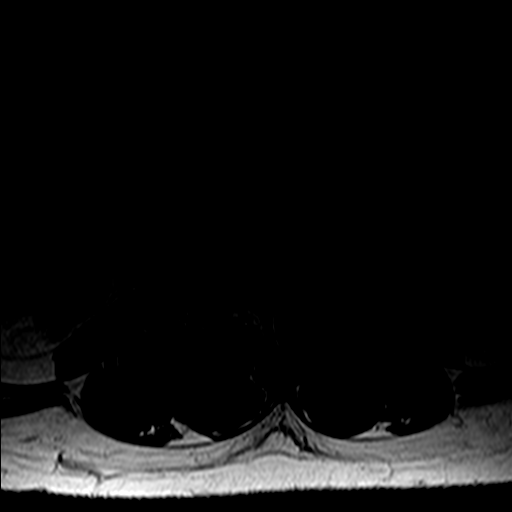
[im 31/37]
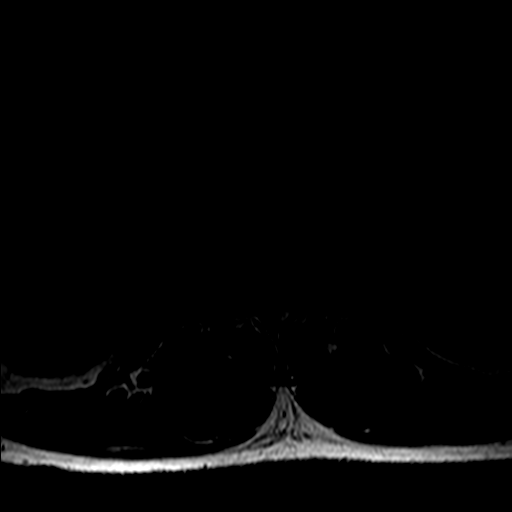

[14 of 48 positions shown; findings below may reference images not displayed]

FINDINGS: Segmentation:  Standard.

Alignment:  There is convex right scoliosis.  No listhesis.

Vertebrae: No fracture or worrisome lesion. Multilevel degenerative
endplate signal change is worst at L2-3 and L4-5.

Conus medullaris and cauda equina: Conus extends to the L1 level.
Conus and cauda equina appear normal.

Paraspinal and other soft tissues: Negative.

Disc levels:

T10-11 and T11-12 are imaged in the sagittal plane only. There is a
minimal disc bulge at T11-12. The central canal and foramina are
widely patent at both levels.

T12-L1: Negative.

L1-2: Negative.

L2-3: Shallow disc bulge eccentric to the left. There is loss of
disc space height. The central canal is open. Mild bilateral
foraminal narrowing is seen.

L3-4: Shallow disc bulge with endplate spur. Mild facet degenerative
change and ligamentum flavum thickening. The central spinal canal
and foramina are open.

L4-5: Mild-to-moderate facet degenerative change is worse on the
left. There is ligamentum flavum thickening. Shallow disc bulge is
identified with a superimposed right foraminal protrusion with
endplate spur. There is mild narrowing in the right subarticular
recess and moderate right foraminal narrowing. The left foramen is
open.

L5-S1: Moderate to advanced facet degenerative disease is worse on
the right. No disc bulge or protrusion. The central canal and
foramina are open.
IMPRESSION: Mild bilateral foraminal narrowing at L2-3 where there is a shallow
disc bulge to the left. The central canal is open.

Right foraminal protrusion with endplate spur causes moderate
foraminal narrowing. There is also mild narrowing in the right
subarticular recess at this level. The central canal and left
foramen are open.

## 2018-07-10 ENCOUNTER — Other Ambulatory Visit: Payer: Self-pay | Admitting: Primary Care

## 2018-07-10 DIAGNOSIS — G8929 Other chronic pain: Secondary | ICD-10-CM

## 2018-07-10 DIAGNOSIS — M5442 Lumbago with sciatica, left side: Secondary | ICD-10-CM

## 2018-07-19 ENCOUNTER — Encounter: Payer: Self-pay | Admitting: Internal Medicine

## 2018-07-19 ENCOUNTER — Ambulatory Visit (AMBULATORY_SURGERY_CENTER): Payer: Self-pay | Admitting: *Deleted

## 2018-07-19 ENCOUNTER — Other Ambulatory Visit: Payer: Self-pay

## 2018-07-19 VITALS — Ht 68.0 in | Wt 175.0 lb

## 2018-07-19 DIAGNOSIS — Z86010 Personal history of colonic polyps: Secondary | ICD-10-CM

## 2018-07-19 MED ORDER — NA SULFATE-K SULFATE-MG SULF 17.5-3.13-1.6 GM/177ML PO SOLN: ORAL | 0 refills | Status: AC

## 2018-07-19 NOTE — Progress Notes (Signed)
Patient's pre-visit was done today over the phone with the patient due to COVID-19 pandemic. Name,DOB and address verified. Insurance verified. Packet of Prep instructions mailed to patient including copy of a consent form and pre-procedure patient acknowledgement form-pt is aware. Suprep $15 Coupon included. Patient understands to call us back with any questions or concerns. Patient denies any allergies to eggs or soy. Patient denies any problems with anesthesia/sedation. Patient denies any oxygen use at home. Patient denies taking any diet/weight loss medications or blood thinners. EMMI education assisgned to patient on colonoscopy, this was explained and instructions given to patient. Pt is aware that care partner will wait in the car during proceudre; if they feel like they will be too hot to wait in the car; they may wait in the lobby.  We want them to wear a mask (we do not have any that we can provide them), practice social distancing, and we will check their temperatures when they get here.  I did remind patient that their care partner needs to stay in the parking lot the entire time. Pt will wear mask into building.

## 2018-08-01 ENCOUNTER — Telehealth: Payer: Self-pay | Admitting: Internal Medicine

## 2018-08-01 NOTE — Telephone Encounter (Signed)
NO answer and mailbox is full, unable to leave message regarding Covid-19 screening questions. Covid-19 Screening Questions:  Do you now or have you had a fever in the last 14 days?   Do you have any respiratory symptoms of shortness of breath or cough now or in the last 14 days?   Do you have any family members or close contacts with diagnosed or suspected Covid-19 in the past 14 days?   Have you been tested for Covid-19 and found to be positive?   Marland Kitchen

## 2018-08-02 ENCOUNTER — Encounter: Payer: Self-pay | Admitting: Internal Medicine

## 2018-08-02 ENCOUNTER — Other Ambulatory Visit: Payer: Self-pay

## 2018-08-02 ENCOUNTER — Ambulatory Visit (AMBULATORY_SURGERY_CENTER): Payer: BC Managed Care – PPO | Admitting: Internal Medicine

## 2018-08-02 VITALS — BP 122/77 | HR 71 | Temp 97.6°F | Resp 20 | Ht 68.0 in | Wt 172.0 lb

## 2018-08-02 DIAGNOSIS — Z86010 Personal history of colonic polyps: Secondary | ICD-10-CM | POA: Diagnosis present

## 2018-08-02 MED ORDER — SODIUM CHLORIDE 0.9 % IV SOLN: 500.0000 mL | Freq: Once | INTRAVENOUS | Status: DC

## 2018-08-02 NOTE — Progress Notes (Signed)
Pt's states no medical or surgical changes since previsit or office visit. Vitals- Courtney Covid- Nancy 

## 2018-08-02 NOTE — Patient Instructions (Signed)
Diverticulosis and internal hemorrhoids noted today. Small polyps present but not removed.  Please read report.  YOU HAD AN ENDOSCOPIC PROCEDURE TODAY AT [facility]:   Refer to the procedure report that was given to you for any specific questions about what was found during the examination.  If the procedure report does not answer your questions, please call your gastroenterologist to clarify.  If you requested that your care partner not be given the details of your procedure findings, then the procedure report has been included in a sealed envelope for you to review at your convenience later.  YOU SHOULD EXPECT: Some feelings of bloating in the abdomen. Passage of more gas than usual.  Walking can help get rid of the air that was put into your GI tract during the procedure and reduce the bloating. If you had a lower endoscopy (such as a colonoscopy or flexible sigmoidoscopy) you may notice spotting of blood in your stool or on the toilet paper. If you underwent a bowel prep for your procedure, you may not have a normal bowel movement for a few days.  Please Note:  You might notice some irritation and congestion in your nose or some drainage.  This is from the oxygen used during your procedure.  There is no need for concern and it should clear up in a day or so.  SYMPTOMS TO REPORT IMMEDIATELY:   Following lower endoscopy (colonoscopy or flexible sigmoidoscopy):  Excessive amounts of blood in the stool  Significant tenderness or worsening of abdominal pains  Swelling of the abdomen that is new, acute  Fever of 100F or higher   For urgent or emergent issues, a gastroenterologist can be reached at any hour by calling [phone].   DIET:  We do recommend a small meal at first, but then you may proceed to your regular diet.  Drink plenty of fluids but you should avoid alcoholic beverages for 24 hours.  ACTIVITY:  You should plan to take it easy for the rest of today and you  should NOT DRIVE or use heavy machinery until tomorrow (because of the sedation medicines used during the test).    FOLLOW UP: Our staff will call the number listed on your records 48-72 hours following your procedure to check on you and address any questions or concerns that you may have regarding the information given to you following your procedure. If we do not reach you, we will leave a message.  We will attempt to reach you two times.  During this call, we will ask if you have developed any symptoms of COVID 19. If you develop any symptoms (ie: fever, flu-like symptoms, shortness of breath, cough etc.) before then, please call [phone].  If you test positive for Covid 19 in the 2 weeks post procedure, please call and report this information to [place].    If any biopsies were taken you will be contacted by phone or by letter within the next 1-3 weeks.  Please call us at [phone] if you have not heard about the biopsies in 3 weeks.    SIGNATURES/CONFIDENTIALITY: You and/or your care partner have signed paperwork which will be entered into your electronic medical record.  These signatures attest to the fact that that the information above on your After Visit Summary has been reviewed and is understood.  Full responsibility of the confidentiality of this discharge information lies with you and/or your care-partner.

## 2018-08-02 NOTE — Progress Notes (Signed)
Report given to PACU, vss 

## 2018-08-02 NOTE — Op Note (Signed)
[facility] Patient Name: Erik [name] Procedure Date: 08/02/2018 10:21 AM MRN: [id number] Endoscopist: Jerene Bears , MD Age: 63 Referring MD:  Date of Birth: September 16, 1955 Gender: Male Account #: [id number] Procedure:                Colonoscopy Indications:              Surveillance: Personal history of adenomatous                            polyps and sessile serrated polyps previous                            colonoscopy, hyperplastic polyps distally and last                            colonoscopy Dec 2016 Medicines:                Monitored Anesthesia Care Procedure:                Pre-Anesthesia Assessment:                           - Prior to the procedure, a History and Physical                            was performed, and patient medications and                            allergies were reviewed. The patient's tolerance of                            previous anesthesia was also reviewed. The risks                            and benefits of the procedure and the sedation                            options and risks were discussed with the patient.                            All questions were answered, and informed consent                            was obtained. Prior Anticoagulants: The patient has                            taken no previous anticoagulant or antiplatelet                            agents. ASA Grade Assessment: II - A patient with                            mild systemic disease. After reviewing the risks  and benefits, the patient was deemed in                            satisfactory condition to undergo the procedure.                           After obtaining informed consent, the colonoscope                            was passed under direct vision. Throughout the                            procedure, the patient's blood pressure, pulse, and                            oxygen saturations were monitored continuously. The                           Colonoscope was introduced through the anus and                            advanced to the terminal ileum. The colonoscopy was                            performed without difficulty. The patient tolerated                            the procedure well. The quality of the bowel                            preparation was good. The ileocecal valve,                            appendiceal orifice, and rectum were photographed. Scope In: 10:25:08 AM Scope Out: 10:38:13 AM Scope Withdrawal Time: 0 hours 8 minutes 14 seconds  Total Procedure Duration: 0 hours 13 minutes 5 seconds  Findings:                 The digital rectal exam was normal.                           Many small and large-mouthed diverticula were found                            from ascending colon to sigmoid colon.                           A few sessile polyps were found in the                            recto-sigmoid colon and distal sigmoid colon. The                            polyps were small in size. Polypectomy was not  attempted given known history of distal                            hyperplastic polyps and these polyp have the                            typical appearance of benign hyperplastic polyps                            today.                           Internal hemorrhoids were found during                            retroflexion. The hemorrhoids were large. Complications:            No immediate complications. Estimated Blood Loss:     Estimated blood loss: none. Impression:               - Diverticulosis from ascending colon to sigmoid                            colon.                           - A few small polyps at the recto-sigmoid colon and                            in the distal sigmoid colon.                           - Internal hemorrhoids.                           - No specimens collected. Recommendation:           - Patient has a contact number  available for                            emergencies. The signs and symptoms of potential                            delayed complications were discussed with the                            patient. Return to normal activities tomorrow.                            Written discharge instructions were provided to the                            patient.                           - Resume previous diet.                           -  Continue present medications.                           - Repeat colonoscopy in 5 years for surveillance. Jerene Bears, MD 08/02/2018 10:45:30 AM This report has been signed electronically.

## 2018-08-06 ENCOUNTER — Telehealth: Payer: Self-pay | Admitting: *Deleted

## 2018-08-06 ENCOUNTER — Other Ambulatory Visit: Payer: Self-pay | Admitting: Primary Care

## 2018-08-06 DIAGNOSIS — G8929 Other chronic pain: Secondary | ICD-10-CM

## 2018-08-06 NOTE — Telephone Encounter (Signed)
  Follow up Call-  Call back number 08/02/2018  Post procedure Call Back phone  # [phone]  Permission to leave phone message Yes  Some recent data might be hidden     Patient questions:  Do you have a fever, pain , or abdominal swelling? No. Pain Score  0 *  Have you tolerated food without any problems? Yes.    Have you been able to return to your normal activities? Yes.    Do you have any questions about your discharge instructions: Diet   No. Medications  No. Follow up visit  No.  Do you have questions or concerns about your Care? No.  Actions: * If pain score is 4 or above: No action needed, pain <4.  1. Have you developed a fever since your procedure? no  2.   Have you had an respiratory symptoms (SOB or cough) since your procedure? no  3.   Have you tested positive for COVID 19 since your procedure no  4.   Have you had any family members/close contacts diagnosed with the COVID 19 since your procedure?  no   If yes to any of these questions please route to Joylene John, RN and Alphonsa Gin, Therapist, sports.

## 2018-08-29 ENCOUNTER — Other Ambulatory Visit: Payer: Self-pay | Admitting: Primary Care

## 2018-08-29 DIAGNOSIS — G8929 Other chronic pain: Secondary | ICD-10-CM

## 2018-08-29 DIAGNOSIS — M545 Low back pain, unspecified: Secondary | ICD-10-CM

## 2018-10-29 ENCOUNTER — Other Ambulatory Visit: Payer: Self-pay | Admitting: Primary Care

## 2018-10-29 DIAGNOSIS — G8929 Other chronic pain: Secondary | ICD-10-CM

## 2018-10-29 DIAGNOSIS — M545 Low back pain: Secondary | ICD-10-CM

## 2018-11-14 ENCOUNTER — Other Ambulatory Visit: Payer: Self-pay | Admitting: Primary Care

## 2018-11-15 ENCOUNTER — Telehealth: Payer: Self-pay | Admitting: *Deleted

## 2018-11-15 NOTE — Telephone Encounter (Signed)
Tried to call patient on 11/14/2018 and 11/15/2018. Voicemail box is full.  Need to inform patient that he does not need CPE appointment just the lab appointment.  On his current medications, patients have 1 year supply on all of them so not sure there. He should check with [facility]

## 2018-11-15 NOTE — Telephone Encounter (Signed)
-----   Message from Pleas Koch, NP sent at 11/14/2018  1:02 PM EST ----- Regarding: RE: lab orders for Tuesday, 11.10.20 Erik [name],  Patient had CPE in March 2020. Did he get new insurance and is wanting another or is he just needing medication refills? He does need a repeat lipid panel and LFT's so we do need him to come in as scheduled for that. Otherwise he doesn't need to see me until March 2021.  Let me know, Allie Bossier, NP-C ----- Message ----- From: Ellamae Sia Sent: 11/14/2018  12:44 PM EST To: Pleas Koch, NP Subject: lab orders for Tuesday, 11.10.20               Patient is scheduled for CPX labs, please order future labs, Thanks , Karna Christmas

## 2018-11-19 ENCOUNTER — Other Ambulatory Visit: Payer: Self-pay | Admitting: Primary Care

## 2018-11-19 DIAGNOSIS — E782 Mixed hyperlipidemia: Secondary | ICD-10-CM

## 2018-11-19 NOTE — Telephone Encounter (Addendum)
Spoken and notified patient of Erik [name] comments. Patient verbalized understanding. He thought that he needed an appointment to get refills. I told him to check with pharmacy since he was provided with refills on his CPE in March.  CPE has been cancel. I remind patient to come in tomorrow for lab

## 2018-11-20 ENCOUNTER — Other Ambulatory Visit (INDEPENDENT_AMBULATORY_CARE_PROVIDER_SITE_OTHER): Payer: Self-pay

## 2018-11-20 ENCOUNTER — Other Ambulatory Visit: Payer: Self-pay

## 2018-11-20 DIAGNOSIS — E782 Mixed hyperlipidemia: Secondary | ICD-10-CM

## 2018-11-20 LAB — HEPATIC FUNCTION PANEL
ALT: 12 U/L (ref 0–53)
AST: 12 U/L (ref 0–37)
Albumin: 4.2 g/dL (ref 3.5–5.2)
Alkaline Phosphatase: 79 U/L (ref 39–117)
Bilirubin, Direct: 0.1 mg/dL (ref 0.0–0.3)
Total Bilirubin: 0.4 mg/dL (ref 0.2–1.2)
Total Protein: 7.2 g/dL (ref 6.0–8.3)

## 2018-11-20 LAB — LIPID PANEL
Cholesterol: 187 mg/dL (ref 0–200)
HDL: 59.8 mg/dL (ref >39.00)
LDL Cholesterol: 113 mg/dL — ABNORMAL HIGH (ref 0–99)
NonHDL: 127.55
Total CHOL/HDL Ratio: 3
Triglycerides: 75 mg/dL (ref 0.0–149.0)
VLDL: 15 mg/dL (ref 0.0–40.0)

## 2018-11-22 ENCOUNTER — Other Ambulatory Visit: Payer: Self-pay | Admitting: Primary Care

## 2018-11-22 DIAGNOSIS — E782 Mixed hyperlipidemia: Secondary | ICD-10-CM

## 2018-11-22 MED ORDER — ATORVASTATIN CALCIUM 40 MG PO TABS: 40.0000 mg | ORAL_TABLET | Freq: Every evening | ORAL | 1 refills | Status: AC

## 2018-11-27 ENCOUNTER — Ambulatory Visit: Payer: BC Managed Care – PPO | Admitting: Primary Care

## 2018-12-29 ENCOUNTER — Telehealth: Payer: Self-pay | Admitting: Family Medicine

## 2018-12-31 NOTE — Telephone Encounter (Signed)
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 Needs to speak to the on call provider. One of her patients has died and she needs clearance to move the body. Additional Comment CAN NOT MOVE BODY UNTIL SHE SPEAKS TO ON CALL Patient Name Erik [name] Patient DOB 03-Jul-1955 Requesting Provider [place] Physician Number [place] Name [facility] Disp. Time Disposition Final User 2019/01/01 5:32:58 PM Send to Bandera, Fallon 01-01-19 5:41:28 PM Paged On Call back to Call Roodhouse, Jessica 01-01-19 5:58:10 PM Page Completed Yes Sarajane Marek 2019/01/01 6:00:43 PM Paged On Call back to Call [place], [place] Phone DateTime Result/Outcome Message Type Notes Ria Bush - MD [id number] 01/01/19 5:41:28 PM Paged On Call Back to Call Center Doctor Paged This is Janett Billow in the Call Center. Please call us at [phone] regarding a page. Thank you. Ria Bush - MD 01/01/19 5:56:47 PM Spoke with On Call - General Message Result Call Closed By: Sarajane Marek Transaction Date/Time: January 01, 2019 5:29:31 PM (ET)

## 2018-12-31 NOTE — Telephone Encounter (Signed)
Noted  

## 2019-01-11 NOTE — Telephone Encounter (Signed)
Spoke with EMS regarding patient found passed away at home this afternoon.  Last seen well at ~1pm, wife left the house at that time.  When wife arrived later in the afternoon, pt found down, EMS called, unsuccessful CPR. Pronounced around 5pm.  No concerns for overdose or foul play noted by EMS.  EMS touched base with medical examiner who didn't think autopsy indicated.  I advised for EMS to touch base with family and if agreeable ok to release body. They will send death certificate to PCP.

## 2019-01-11 DEATH — deceased
